# Patient Record
Sex: Male | Born: 1970 | Race: White | Hispanic: No | Marital: Single | Smoking: Never smoker
Health system: Southern US, Community
[De-identification: ages and names within clinical notes are randomized; demographics above are authoritative.]

## PROBLEM LIST (undated history)

## (undated) DIAGNOSIS — I10 Essential (primary) hypertension: Secondary | ICD-10-CM

## (undated) DIAGNOSIS — R55 Syncope and collapse: Secondary | ICD-10-CM

## (undated) DIAGNOSIS — C801 Malignant (primary) neoplasm, unspecified: Secondary | ICD-10-CM

---

## 2015-12-08 ENCOUNTER — Emergency Department (HOSPITAL_BASED_OUTPATIENT_CLINIC_OR_DEPARTMENT_OTHER)
Admission: EM | Admit: 2015-12-08 | Discharge: 2015-12-08 | Disposition: A | Discharge status: Home / Self Care | Payer: BLUE CROSS/BLUE SHIELD | Attending: Emergency Medicine | Admitting: Emergency Medicine

## 2015-12-08 ENCOUNTER — Encounter (HOSPITAL_BASED_OUTPATIENT_CLINIC_OR_DEPARTMENT_OTHER): Payer: Self-pay | Admitting: Emergency Medicine

## 2015-12-08 ENCOUNTER — Emergency Department (HOSPITAL_BASED_OUTPATIENT_CLINIC_OR_DEPARTMENT_OTHER): Payer: BLUE CROSS/BLUE SHIELD

## 2015-12-08 DIAGNOSIS — I10 Essential (primary) hypertension: Secondary | ICD-10-CM | POA: Insufficient documentation

## 2015-12-08 DIAGNOSIS — Z79899 Other long term (current) drug therapy: Secondary | ICD-10-CM | POA: Diagnosis not present

## 2015-12-08 DIAGNOSIS — R079 Chest pain, unspecified: Secondary | ICD-10-CM

## 2015-12-08 DIAGNOSIS — R0789 Other chest pain: Secondary | ICD-10-CM | POA: Insufficient documentation

## 2015-12-08 HISTORY — DX: Syncope and collapse: R55

## 2015-12-08 HISTORY — DX: Essential (primary) hypertension: I10

## 2015-12-08 LAB — CBC
HCT: 44.2 % (ref 39.0–52.0)
Hemoglobin: 15.5 g/dL (ref 13.0–17.0)
MCH: 32.4 pg (ref 26.0–34.0)
MCHC: 35.1 g/dL (ref 30.0–36.0)
MCV: 92.5 fL (ref 78.0–100.0)
Platelets: 259 10*3/uL (ref 150–400)
RBC: 4.78 MIL/uL (ref 4.22–5.81)
RDW: 12.9 % (ref 11.5–15.5)
WBC: 12.8 10*3/uL — ABNORMAL HIGH (ref 4.0–10.5)

## 2015-12-08 LAB — BASIC METABOLIC PANEL
Anion gap: 10 (ref 5–15)
BUN: 17 mg/dL (ref 6–20)
CO2: 25 mmol/L (ref 22–32)
Calcium: 9.2 mg/dL (ref 8.9–10.3)
Chloride: 103 mmol/L (ref 101–111)
Creatinine, Ser: 1.01 mg/dL (ref 0.61–1.24)
GFR calc Af Amer: >60 mL/min (ref >60)
GFR calc non Af Amer: >60 mL/min (ref >60)
Glucose, Bld: 115 mg/dL — ABNORMAL HIGH (ref 65–99)
Potassium: 3.8 mmol/L (ref 3.5–5.1)
Sodium: 138 mmol/L (ref 135–145)

## 2015-12-08 LAB — TROPONIN I
Troponin I: <0.03 ng/mL (ref <0.031)
Troponin I: <0.03 ng/mL (ref <0.031)

## 2015-12-08 NOTE — ED Notes (Signed)
Pt requested something to eat or drink. Dr Wilson Singer aware. Pt given crackers, peanut butter and coke per his request.

## 2015-12-08 NOTE — ED Provider Notes (Signed)
CSN: [id number]     Arrival date & time 12/08/15  0718 History   First MD Initiated Contact with Patient 12/08/15 0725     Chief Complaint  Patient presents with  . Chest Pain     (Consider location/radiation/quality/duration/timing/severity/associated sxs/prior Treatment) HPI   45 year old male with chest pain. Patient describes waking up from sleep at approximately 5 AM with tightness in his chest. He sat up in bed and began to feel dizzy and mildly diaphoretic. He reports that his pain felt better when sitting up though. While sitting up and began having abdominal pain felt the urge to have a bowel movement. He went to the bathroom his abdominal pain improved. He continues to have the chest tightness though. It has been constant since it first started but it progressively been improving. He has noticed that it is worse when he takes a deep breath. He does not feel short of breath though. No cough. No fevers or chills. No unusual leg pain or swelling. No known history of CAD that he is aware of. History of hypertension.  Past Medical History  Diagnosis Date  . Hypertension   . Vasovagal episode    History reviewed. No pertinent past surgical history. No family history on file. Social History  Substance Use Topics  . Smoking status: Never Smoker   . Smokeless tobacco: None  . Alcohol Use: Yes    Review of Systems  All systems reviewed and negative, other than as noted in HPI.   Allergies  Penicillins  Home Medications   Prior to Admission medications   Medication Sig Start Date End Date Taking? Authorizing Provider  lisinopril (PRINIVIL,ZESTRIL) 20 MG tablet Take 20 mg by mouth daily.   Yes Historical Provider, MD   BP 183/112 mmHg  Pulse 88  Temp(Src) 98.1 F (36.7 C) (Oral)  Resp 20  Ht 5\' 11"  (1.803 m)  Wt 238 lb (107.956 kg)  BMI 33.21 kg/m2  SpO2 97% Physical Exam  Constitutional: He appears well-developed and well-nourished. No distress.  HENT:  Head:  Normocephalic and atraumatic.  Eyes: Conjunctivae are normal. Right eye exhibits no discharge. Left eye exhibits no discharge.  Neck: Neck supple.  Cardiovascular: Normal rate, regular rhythm and normal heart sounds.  Exam reveals no gallop and no friction rub.   No murmur heard. Pulmonary/Chest: Effort normal and breath sounds normal. No respiratory distress. He exhibits no tenderness.  Abdominal: Soft. He exhibits no distension. There is no tenderness.  Musculoskeletal: He exhibits no edema or tenderness.  Lower extremities symmetric as compared to each other. No calf tenderness. Negative Homan's. No palpable cords.   Neurological: He is alert.  Skin: Skin is warm and dry.  Psychiatric: He has a normal mood and affect. His behavior is normal. Thought content normal.  Nursing note and vitals reviewed.   ED Course  Procedures (including critical care time) Labs Review Labs Reviewed  BASIC METABOLIC PANEL - Abnormal; Notable for the following:    Glucose, Bld 115 (*)    All other components within normal limits  CBC - Abnormal; Notable for the following:    WBC 12.8 (*)    All other components within normal limits  TROPONIN I  TROPONIN I    Imaging Review No results found. I have personally reviewed and evaluated these images and lab results as part of my medical decision-making.   EKG Interpretation   Date/Time:  Wednesday Dec 08 2015 07:25:29 EDT Ventricular Rate:  93 PR Interval:  186 QRS  Duration: 120 QT Interval:  390 QTC Calculation: 485 R Axis:   37 Text Interpretation:  Sinus rhythm Nonspecific intraventricular conduction  delay Borderline T abnormalities, diffuse leads No old tracing to compare  Confirmed by Rihanna Marseille  MD, [place] ([id number]) on 12/08/2015 7:50:07 AM      MDM   Final diagnoses:  Chest pain, unspecified chest pain type    45 year old male with chest tightness. Improved since onset but still mildly persistent. His exam is nonfocal. He is afebrile.  He has no increased work of breathing. Hypertension was noted. His EKG is not normal with diffuse T-wave abnormalities and intraventricular conduction delay. I do not have an old one to compare it to. Consider pericarditis with positional changes. Potentially could be. No rub on exam. No recent viral prodrome. Doubt PE, dissection or infectios process.  8:21 AM Labs unremarkable. Pt updated. No new complaints. Still very mild/vague discomfort but still improving. Will repeat troponin.   Repeat troponin is normal.  I doubt that this is ACS.  At this time, I feel he is stable for discharge.  Given his age and history of hypertension and I think it may be prudent for to obtain stress test.  [place] follow-up with his PCP to discuss this further.  Return precautions sooner were discussed.  Virgel Manifold, MD 12/19/15 [phone]

## 2015-12-08 NOTE — Discharge Instructions (Signed)
Nonspecific Chest Pain  °Chest pain can be caused by many different conditions. There is always a chance that your pain could be related to something serious, such as a heart attack or a blood clot in your lungs. Chest pain can also be caused by conditions that are not life-threatening. If you have chest pain, it is very important to follow up with your health care provider. °CAUSES  °Chest pain can be caused by: °· Heartburn. °· Pneumonia or bronchitis. °· Anxiety or stress. °· Inflammation around your heart (pericarditis) or lung (pleuritis or pleurisy). °· A blood clot in your lung. °· A collapsed lung (pneumothorax). It can develop suddenly on its own (spontaneous pneumothorax) or from trauma to the chest. °· Shingles infection (varicella-zoster virus). °· Heart attack. °· Damage to the bones, muscles, and cartilage that make up your chest wall. This can include: °¨ Bruised bones due to injury. °¨ Strained muscles or cartilage due to frequent or repeated coughing or overwork. °¨ Fracture to one or more ribs. °¨ Sore cartilage due to inflammation (costochondritis). °RISK FACTORS  °Risk factors for chest pain may include: °· Activities that increase your risk for trauma or injury to your chest. °· Respiratory infections or conditions that cause frequent coughing. °· Medical conditions or overeating that can cause heartburn. °· Heart disease or family history of heart disease. °· Conditions or health behaviors that increase your risk of developing a blood clot. °· Having had chicken pox (varicella zoster). °SIGNS AND SYMPTOMS °Chest pain can feel like: °· Burning or tingling on the surface of your chest or deep in your chest. °· Crushing, pressure, aching, or squeezing pain. °· Dull or sharp pain that is worse when you move, cough, or take a deep breath. °· Pain that is also felt in your back, neck, shoulder, or arm, or pain that spreads to any of these areas. °Your chest pain may come and go, or it may stay  constant. °DIAGNOSIS °Lab tests or other studies may be needed to find the cause of your pain. Your health care provider may have you take a test called an ambulatory ECG (electrocardiogram). An ECG records your heartbeat patterns at the time the test is performed. You may also have other tests, such as: °· Transthoracic echocardiogram (TTE). During echocardiography, sound waves are used to create a picture of all of the heart structures and to look at how blood flows through your heart. °· Transesophageal echocardiogram (TEE). This is a more advanced imaging test that obtains images from inside your body. It allows your health care provider to see your heart in finer detail. °· Cardiac monitoring. This allows your health care provider to monitor your heart rate and rhythm in real time. °· Holter monitor. This is a portable device that records your heartbeat and can help to diagnose abnormal heartbeats. It allows your health care provider to track your heart activity for several days, if needed. °· Stress tests. These can be done through exercise or by taking medicine that makes your heart beat more quickly. °· Blood tests. °· Imaging tests. °TREATMENT  °Your treatment depends on what is causing your chest pain. Treatment may include: °· Medicines. These may include: °¨ Acid blockers for heartburn. °¨ Anti-inflammatory medicine. °¨ Pain medicine for inflammatory conditions. °¨ Antibiotic medicine, if an infection is present. °¨ Medicines to dissolve blood clots. °¨ Medicines to treat coronary artery disease. °· Supportive care for conditions that do not require medicines. This may include: °¨ Resting. °¨ Applying heat   or cold packs to injured areas. °¨ Limiting activities until pain decreases. °HOME CARE INSTRUCTIONS °· If you were prescribed an antibiotic medicine, finish it all even if you start to feel better. °· Avoid any activities that bring on chest pain. °· Do not use any tobacco products, including  cigarettes, chewing tobacco, or electronic cigarettes. If you need help quitting, ask your health care provider. °· Do not drink alcohol. °· Take medicines only as directed by your health care provider. °· Keep all follow-up visits as directed by your health care provider. This is important. This includes any further testing if your chest pain does not go away. °· If heartburn is the cause for your chest pain, you may be told to keep your head raised (elevated) while sleeping. This reduces the chance that acid will go from your stomach into your esophagus. °· Make lifestyle changes as directed by your health care provider. These may include: °¨ Getting regular exercise. Ask your health care provider to suggest some activities that are safe for you. °¨ Eating a heart-healthy diet. A registered dietitian can help you to learn healthy eating options. °¨ Maintaining a healthy weight. °¨ Managing diabetes, if necessary. °¨ Reducing stress. °SEEK MEDICAL CARE IF: °· Your chest pain does not go away after treatment. °· You have a rash with blisters on your chest. °· You have a fever. °SEEK IMMEDIATE MEDICAL CARE IF:  °· Your chest pain is worse. °· You have an increasing cough, or you cough up blood. °· You have severe abdominal pain. °· You have severe weakness. °· You faint. °· You have chills. °· You have sudden, unexplained chest discomfort. °· You have sudden, unexplained discomfort in your arms, back, neck, or jaw. °· You have shortness of breath at any time. °· You suddenly start to sweat, or your skin gets clammy. °· You feel nauseous or you vomit. °· You suddenly feel light-headed or dizzy. °· Your heart begins to beat quickly, or it feels like it is skipping beats. °These symptoms may represent a serious problem that is an emergency. Do not wait to see if the symptoms will go away. Get medical help right away. Call your local emergency services (911 in the [place] Do not drive yourself to the hospital. °  °This  information is not intended to replace advice given to you by your health care provider. Make sure you discuss any questions you have with your health care provider. °  °Document Released: 04/05/2005 Document Revised: 07/17/2014 Document Reviewed: 01/30/2014 °Elsevier Interactive Patient Education ©2016 [employer]. ° °

## 2015-12-08 NOTE — ED Notes (Signed)
MD at bedside. 

## 2015-12-08 NOTE — ED Notes (Addendum)
Pt reports be woke up at 5 am with chest pain in the center of his chest and dizziness. Pt states he then had abd pain and had a BM. Pt denies dizziness at present. Pt reports breaking out in a sweat. Pt states pain is worse when he lays flat. Denies SOB.

## 2018-01-17 IMAGING — CR DG CHEST 2V
2 series · 2 of 2 positions shown · non-contrast
Comparison: None.

CLINICAL DATA: Chest pain and dizziness 1 day

EXAM:
CHEST  2 VIEW

[w chest pa]
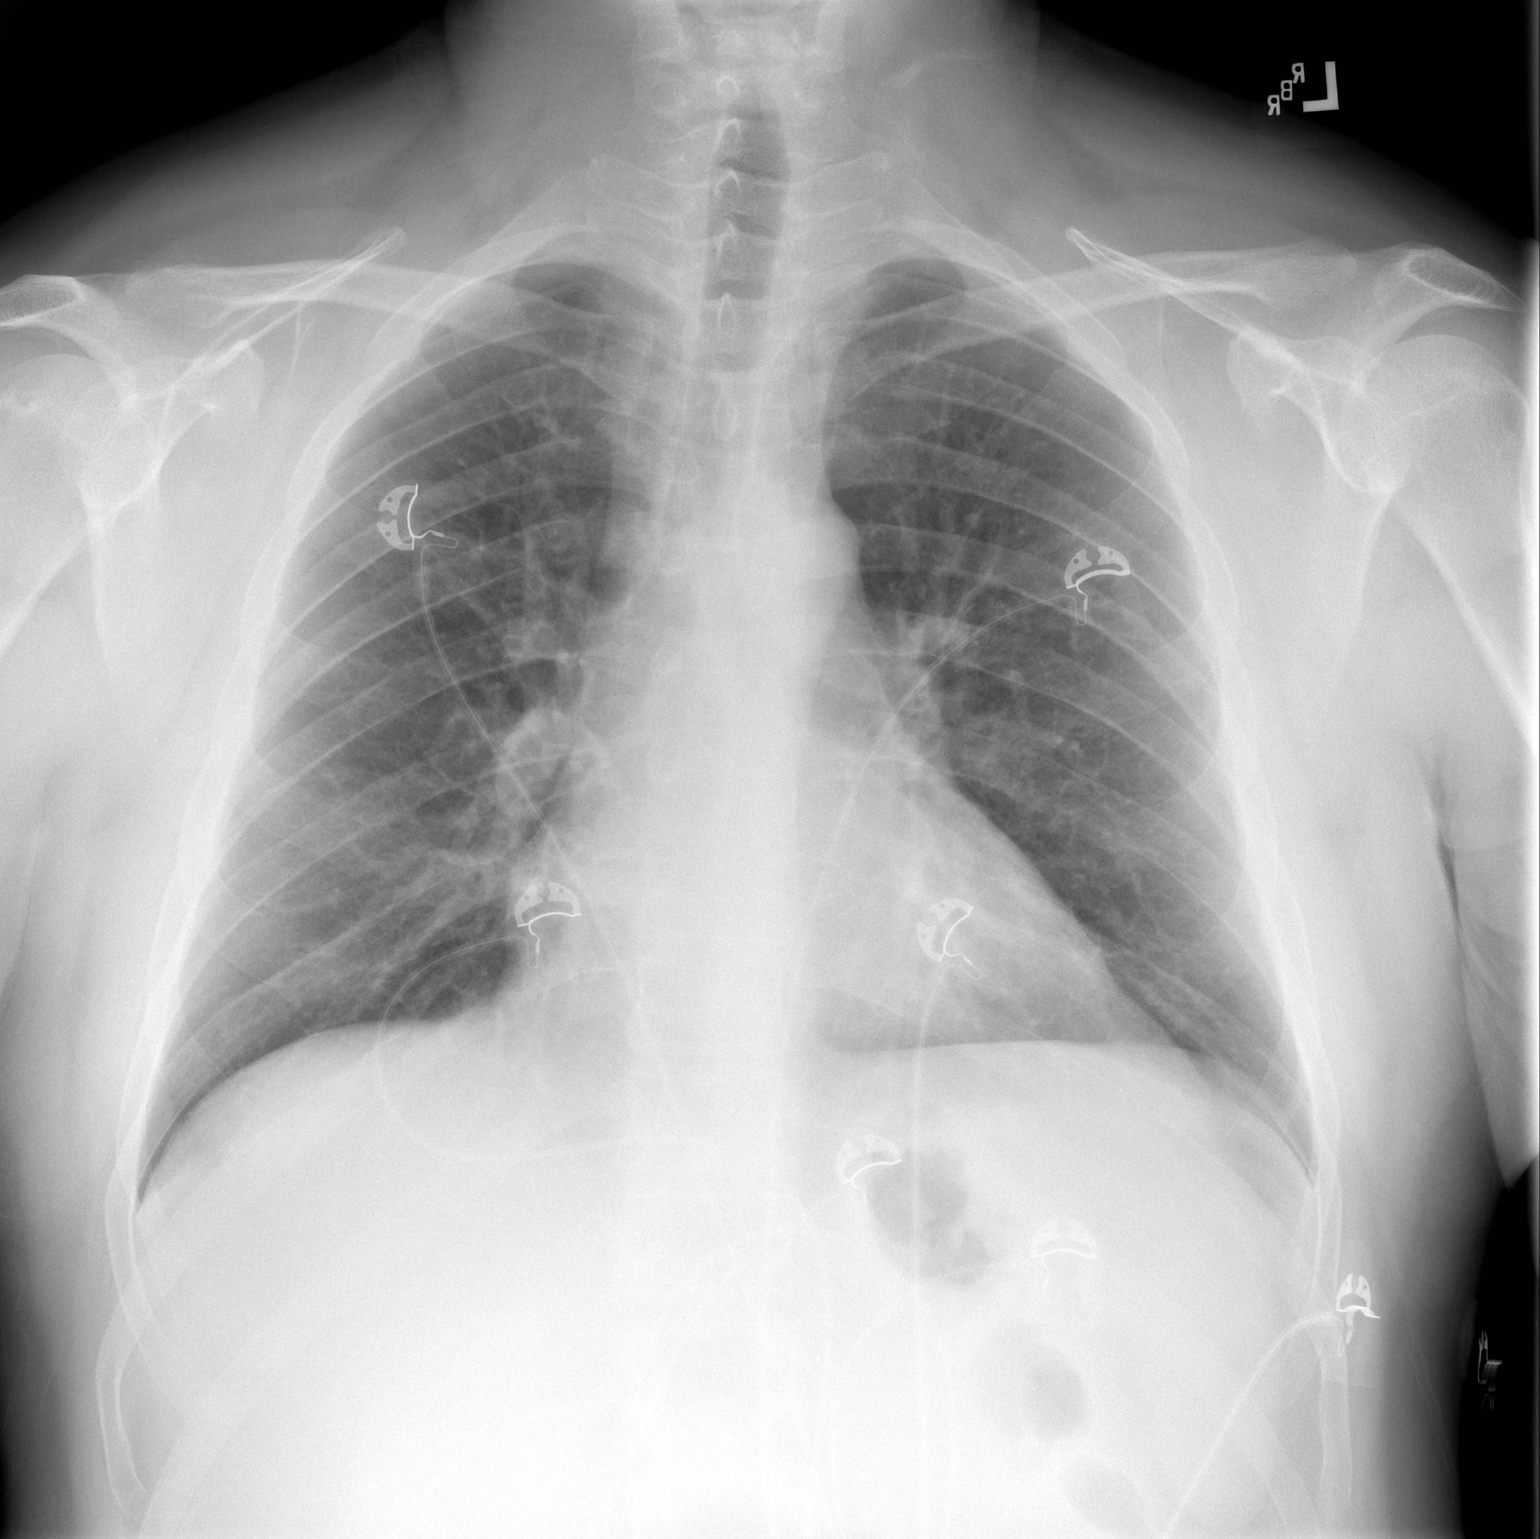

[w chest lat]
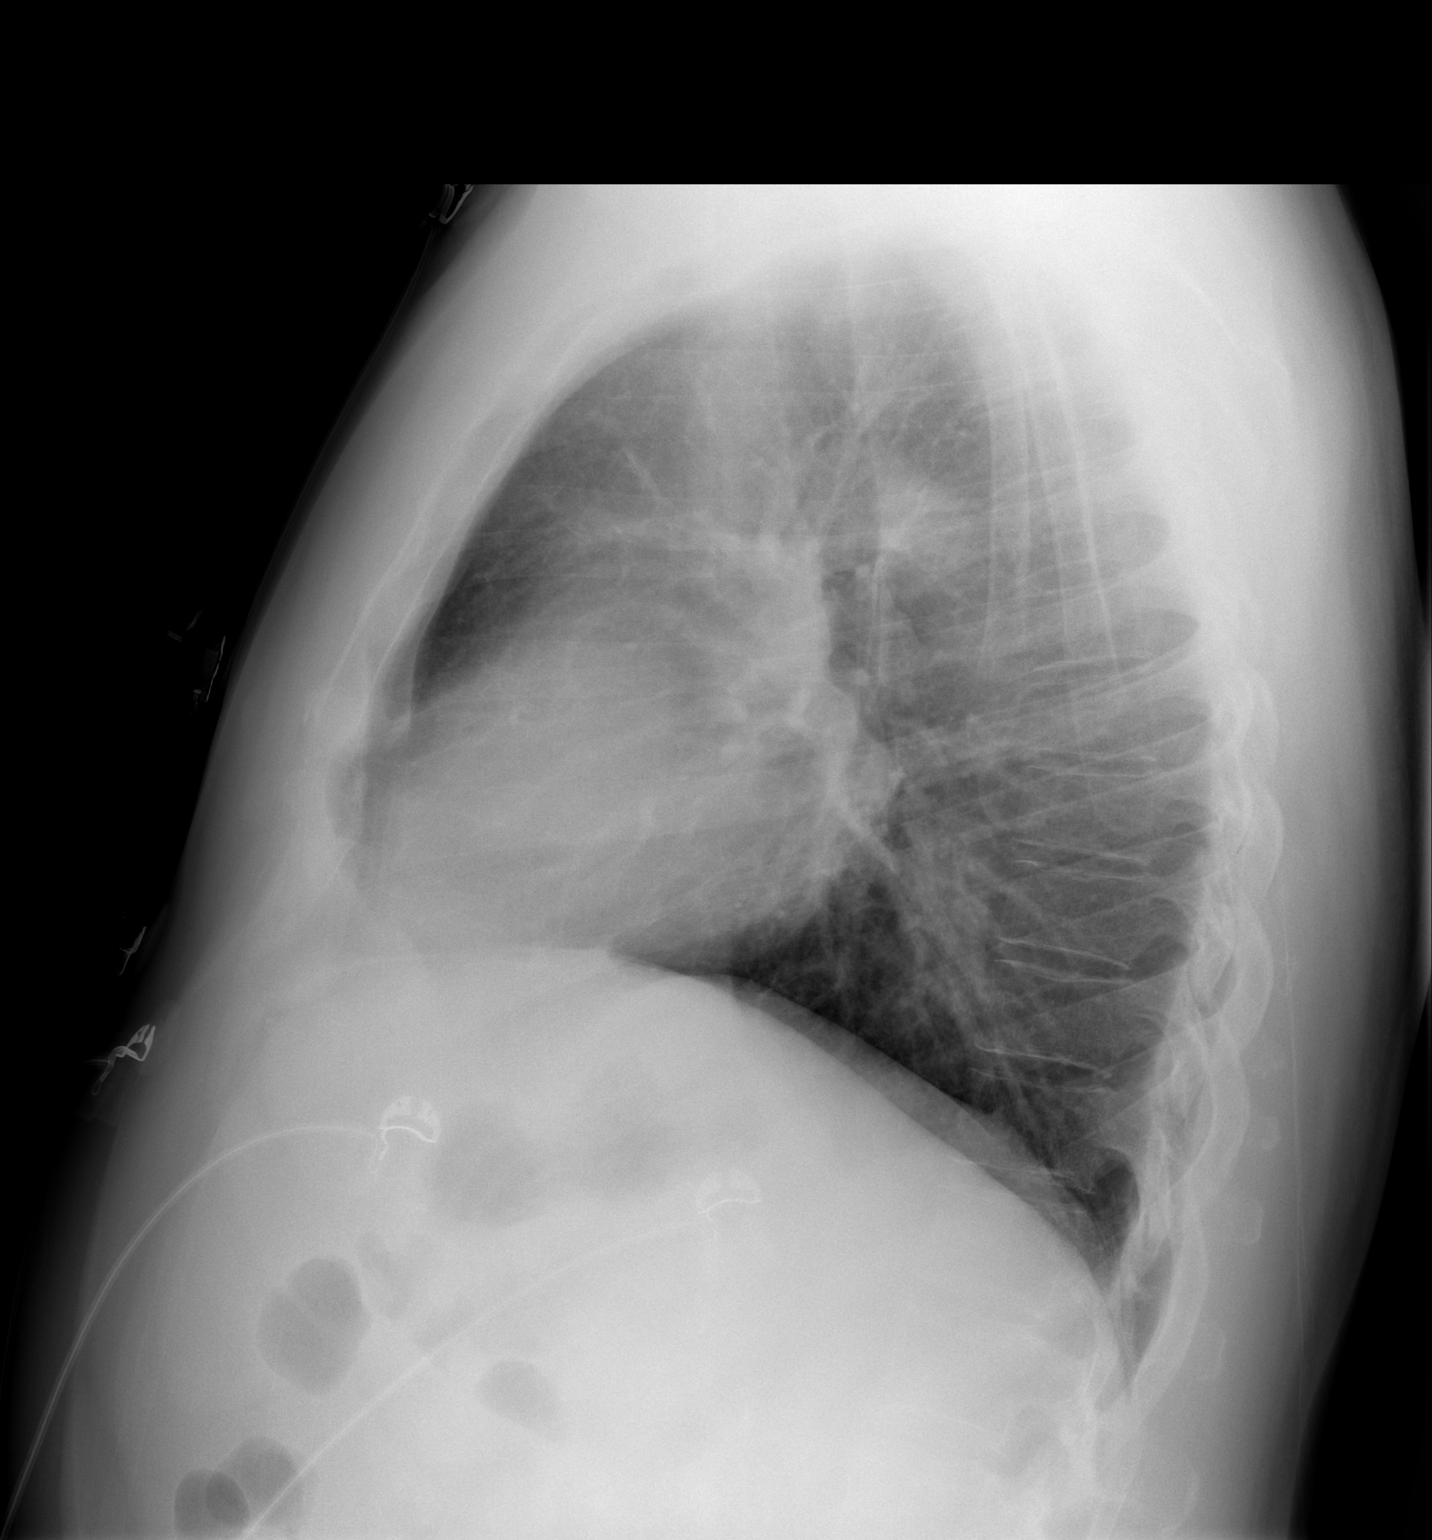

[2 of 2 positions shown; findings below may reference images not displayed]

FINDINGS: Lungs are clear. Heart size and pulmonary vascularity are normal. No
adenopathy. No pneumothorax. No bone lesions.
IMPRESSION: No edema or consolidation.

## 2019-09-04 DIAGNOSIS — L920 Granuloma annulare: Secondary | ICD-10-CM | POA: Diagnosis not present

## 2020-04-07 DIAGNOSIS — I10 Essential (primary) hypertension: Secondary | ICD-10-CM | POA: Diagnosis not present

## 2020-04-07 DIAGNOSIS — E782 Mixed hyperlipidemia: Secondary | ICD-10-CM | POA: Diagnosis not present

## 2020-04-07 DIAGNOSIS — R55 Syncope and collapse: Secondary | ICD-10-CM | POA: Diagnosis not present

## 2020-06-25 ENCOUNTER — Telehealth: Payer: Self-pay | Admitting: Pharmacist

## 2020-06-25 ENCOUNTER — Encounter: Payer: Self-pay | Admitting: Internal Medicine

## 2020-06-25 ENCOUNTER — Other Ambulatory Visit: Payer: Self-pay

## 2020-06-25 ENCOUNTER — Ambulatory Visit (INDEPENDENT_AMBULATORY_CARE_PROVIDER_SITE_OTHER): Payer: BLUE CROSS/BLUE SHIELD | Admitting: Internal Medicine

## 2020-06-25 ENCOUNTER — Telehealth: Payer: Self-pay | Admitting: Radiology

## 2020-06-25 VITALS — BP 110/70 | HR 75 | Resp 12 | Ht 70.0 in | Wt 230.0 lb

## 2020-06-25 DIAGNOSIS — R55 Syncope and collapse: Secondary | ICD-10-CM | POA: Diagnosis not present

## 2020-06-25 NOTE — Telephone Encounter (Signed)
Enrolled patient for a 30 day [employer] Event Monitor to be mailed to patients home  

## 2020-06-25 NOTE — Telephone Encounter (Signed)
Pt at [facility] today for his daughter's visit and passed out while seated ~8:50am. No noted triggers. Pt was moved to the floor and legs elevated, he came to and was able to communicate. Cardiologist is Dr Mauricio Po at [place], has had previous syncopal episodes which typically had triggers (standing up too quickly at a restaurant, drinking too much). States he's previously had a vasovagal diagnosis.  Glucose 115, BP 110/56, HR 75, O2 96% EKG: NSR, RBBB, QTc 471  Pt added to Dr Oralia Rud schedule today.

## 2020-06-25 NOTE — Patient Instructions (Addendum)
Medication Instructions:  Your physician recommends that you continue on your current medications as directed. Please refer to the Current Medication list given to you today.  *If you need a refill on your cardiac medications before your next appointment, please call your pharmacy*   Lab Work: None If you have labs (blood work) drawn today and your tests are completely normal, you will receive your results only by:  [facility] (if you have MyChart) OR  A paper copy in the mail If you have any lab test that is abnormal or we need to change your treatment, we will call you to review the results.   Testing/Procedures: Your physician has requested that you have an echocardiogram. Echocardiography is a painless test that uses sound waves to create images of your heart. It provides your doctor with information about the size and shape of your heart and how well your hearts chambers and valves are working. This procedure takes approximately one hour. There are no restrictions for this procedure.  Your physician has recommended that you wear an event monitor. Event monitors are medical devices that record the hearts electrical activity. Doctors most often [place] these monitors to diagnose arrhythmias. Arrhythmias are problems with the speed or rhythm of the heartbeat. The monitor is a small, portable device. You can wear one while you do your normal daily activities. This is usually used to diagnose what is causing palpitations/syncope (passing out).   Follow-Up: At [facility], you and your health needs are our priority.  As part of our continuing mission to provide you with exceptional heart care, we have created designated Provider Care Teams.  These Care Teams include your primary Cardiologist (physician) and Advanced Practice Providers (APPs -  Physician Assistants and Nurse Practitioners) who all work together to provide you with the care you need, when you need it.  We recommend signing  up for the patient portal called "MyChart".  Sign up information is provided on this After Visit Summary.  MyChart is used to connect with patients for Virtual Visits (Telemedicine).  Patients are able to view lab/test results, encounter notes, upcoming appointments, etc.  Non-urgent messages can be sent to your provider as well.   To learn more about what you can do with MyChart, go to [email or web address].    Your next appointment:   2-3 month(s)  The format for your next appointment:   In Person  Provider:   You may see Dr. Rudean Haskell or one of the following Advanced Practice Providers on your designated Care Team:    Melina Copa, PA-C  Ermalinda Barrios, PA-C    Other Instructions  [employer] Cardiac Event Monitor Instructions Your physician has requested you wear your cardiac event monitor for 30 days. [employer] may call or text to confirm a shipping address. The monitor will be sent to a land address via UPS. [employer] will not ship a monitor to a PO BOX. It typically takes 3-5 days to receive your monitor after it has been enrolled. [employer] will assist with USPS tracking if your package is delayed. The telephone number for [employer] is [phone]. Once you have received your monitor, please review the enclosed instructions. Instruction tutorials can also be viewed under help and settings on the enclosed cell phone. Your monitor has already been registered assigning a specific monitor serial # to you.  Applying the monitor Remove cell phone from case and turn it on. The cell phone works as Dealer and needs to be within [employer] of  you at all times. The cell phone will need to be charged on a daily basis. We recommend you plug the cell phone into the enclosed charger at your bedside table every night.  Monitor batteries: You will receive two monitor batteries labelled #1 and #2. These are your recorders. Plug battery #2 onto the second connection on  the enclosed charger. Keep one battery on the charger at all times. This will keep the monitor battery deactivated. It will also keep it fully charged for when you need to switch your monitor batteries. A small light will be blinking on the battery emblem when it is charging. The light on the battery emblem will remain on when the battery is fully charged.  Open package of a Monitor strip. Insert battery #1 into black hood on strip and gently squeeze monitor battery onto connection as indicated in instruction booklet. Set aside while preparing skin.  Choose location for your strip, vertical or horizontal, as indicated in the instruction booklet. Shave to remove all hair from location. There cannot be any lotions, oils, powders, or colognes on skin where monitor is to be applied. Wipe skin clean with enclosed Saline wipe. Dry skin completely.  Peel paper labeled #1 off the back of the Monitor strip exposing the adhesive. Place the monitor on the chest in the vertical or horizontal position shown in the instruction booklet. One arrow on the monitor strip must be pointing upward. Carefully remove paper labeled #2, attaching remainder of strip to your skin. Try not to create any folds or wrinkles in the strip as you apply it.  Firmly press and release the circle in the center of the monitor battery. You will hear a small beep. This is turning the monitor battery on. The heart emblem on the monitor battery will light up every 5 seconds if the monitor battery in turned on and connected to the patient securely. Do not push and hold the circle down as this turns the monitor battery off. The cell phone will locate the monitor battery. A screen will appear on the cell phone checking the connection of your monitor strip. This may read poor connection initially but change to good connection within the next minute. Once your monitor accepts the connection you will hear a series of 3 beeps followed by a  climbing crescendo of beeps. A screen will appear on the cell phone showing the two monitor strip placement options. Touch the picture that demonstrates where you applied the monitor strip.  Your monitor strip and battery are waterproof. You are able to shower, bathe, or swim with the monitor on. They just ask you do not submerge deeper than 3 feet underwater. We recommend removing the monitor if you are swimming in a lake, river, or ocean.  Your monitor battery will need to be switched to a fully charged monitor battery approximately once a week. The cell phone will alert you of an action which needs to be made.  On the cell phone, tap for details to reveal connection status, monitor battery status, and cell phone battery status. The green dots indicates your monitor is in good status. A red dot indicates there is something that needs your attention.  To record a symptom, click the circle on the monitor battery. In 30-60 seconds a list of symptoms will appear on the cell phone. Select your symptom and tap save. Your monitor will record a sustained or significant arrhythmia regardless of you clicking the button. Some patients do not feel the  heart rhythm irregularities. [employer] will notify us of any serious or critical events.  Refer to instruction booklet for instructions on switching batteries, changing strips, the Do not disturb or Pause features, or any additional questions.  Call [employer] at [phone], to confirm your monitor is transmitting and record your baseline. They will answer any questions you may have regarding the monitor instructions at that time.  Returning the monitor to [place] all equipment back into blue box. Peel off strip of paper to expose adhesive and close box securely. There is a prepaid UPS shipping label on this box. Drop in a UPS drop box, or at a UPS facility like [employer]. You may also contact [employer] to arrange UPS to pick up monitor  package at your home.

## 2020-06-25 NOTE — Progress Notes (Signed)
Cardiology Office Note:    Date:  06/25/2020   ID:  Joel Villanueva, DOB April 30, 1971, MRN [id number]  PCP:  No primary care provider on file.  [facility] Cardiologist:  No primary care provider on file.  [facility] Electrophysiologist:  None   Urgent visit CC:  Passed out during his daughter's Cardiology visit.  History of Present Illness:    Joel Villanueva is a 49 y.o. male with a hx of HTN, Vasovagal Syncope who presents for evaluation:  Passed out during his daughter's visit.  Patient felt hot during daughter's visit.  No CP, SOB, DOE.  Has had vasovagal syncope in the past:  Remote history of CP that woke him from sleep and ED visit without clear etiology.  Concern for COVID -19 infection 09/2018 (anosmia, fever, chills, and viral prodrome with COVID occurring in the church).  After this has had multiple episodes of syncope:  May 2021 related to increase alcohol the night prior at a wedding and decreased PO intake.  No sit/stand dizziness.  Has had episode of near syncope in the summer and in September.  No passing out.  Patient noted he felt hot with the mask on (works from home and is not used to wearing masks).  Felt flush and diaphoretic.  Assisted patient to the ground:  BMP stable 110/56.  BG 75.  With leg raise pallor improved.  Got PO Intake at our office and sx resolved.  Able to sit, stand, and return to the sitting in a chair.  BP Post 110/70.  Past Medical History:  Diagnosis Date  . Hypertension   . Vasovagal episode     No past surgical history on file.  Current Medications: No outpatient medications have been marked as taking for the 06/25/20 encounter (Office Visit) with Werner Lean, MD.     Allergies:   Penicillins   Social History   Socioeconomic History  . Marital status: Single    Spouse name: Not on file  . Number of children: Not on file  . Years of education: Not on file  . Highest education level: Not on file  Occupational History   . Not on file  Tobacco Use  . Smoking status: Never Smoker  . Smokeless tobacco: Never Used  Substance and Sexual Activity  . Alcohol use: Yes  . Drug use: No  . Sexual activity: Not on file  Other Topics Concern  . Not on file  Social History Narrative  . Not on file   Social Determinants of Health   Financial Resource Strain: Not on file  Food Insecurity: Not on file  Transportation Needs: Not on file  Physical Activity: Not on file  Stress: Not on file  Social Connections: Not on file     Family History: History of coronary artery disease notable for grandfather with MI. History of heart failure notable for no members. History of arrhythmia notable for no members.  ROS:   Please see the history of present illness.     All other systems reviewed and are negative.  EKGs/Labs/Other Studies Reviewed:    The following studies were reviewed today:   EKG:  EKG is  ordered today.  The ekg ordered today demonstrates SR 75 RBBB with nonspecific TWI   OSH Stress Echo Stress Testing: Date: 03/10/19 Results: Normal EF and no Ischemia; cannot see full study  Recent Labs: No results found for requested labs within last 8760 hours.  Recent Lipid Panel No results found for: CHOL, TRIG,  HDL, CHOLHDL, VLDL, LDLCALC, LDLDIRECT   Risk Assessment/Calculations:     N/A  Physical Exam:    VS:  BP 110/56 Heart rate 75 bpm Pulse 02 96% RR 12  Wt Readings from Last 3 Encounters:  06/25/20 230 lb (104.3 kg)  12/08/15 238 lb (108 kg)    Post syncope exam  GEN:  Well nourished, well developed in no acute distress HEENT: Normal NECK: No JVD; No carotid bruits LYMPHATICS: No lymphadenopathy CARDIAC: RRR, no murmurs, rubs, gallops RESPIRATORY:  Clear to auscultation without rales, wheezing or rhonchi  ABDOMEN: Soft, non-tender, non-distended MUSCULOSKELETAL:  No edema; No deformity  SKIN: Warm and dry NEUROLOGIC:  Alert and oriented x 3 PSYCHIATRIC:  Normal affect    ASSESSMENT:    1. Syncope, unspecified syncope type    PLAN:    In order of problems listed above:  Syncope - Rapid Response Care as above - will get echocardiogram and [employer] Monitor (Live 30 day) - In follow up if negative may look at [facility] and [facility] - Patient's daughter will drive home  2-3 follow up unless new symptoms or abnormal test results warranting change in plan     Medication Adjustments/Labs and Tests Ordered: Current medicines are reviewed at length with the patient today.  Concerns regarding medicines are outlined above.  Orders Placed This Encounter  Procedures  . Cardiac event monitor  . ECHOCARDIOGRAM COMPLETE   No orders of the defined types were placed in this encounter.   Patient Instructions  Medication Instructions:  Your physician recommends that you continue on your current medications as directed. Please refer to the Current Medication list given to you today.  *If you need a refill on your cardiac medications before your next appointment, please call your pharmacy*   Lab Work: None If you have labs (blood work) drawn today and your tests are completely normal, you will receive your results only by: Marland Kitchen MyChart Message (if you have MyChart) OR . A paper copy in the mail If you have any lab test that is abnormal or we need to change your treatment, we will call you to review the results.   Testing/Procedures: Your physician has requested that you have an echocardiogram. Echocardiography is a painless test that uses sound waves to create images of your heart. It provides your doctor with information about the size and shape of your heart and how well your heart's chambers and valves are working. This procedure takes approximately one hour. There are no restrictions for this procedure.  Your physician has recommended that you wear an event monitor. Event monitors are medical devices that record the heart's electrical  activity. Doctors most often [place] these monitors to diagnose arrhythmias. Arrhythmias are problems with the speed or rhythm of the heartbeat. The monitor is a small, portable device. You can wear one while you do your normal daily activities. This is usually used to diagnose what is causing palpitations/syncope (passing out).   Follow-Up: At [facility], you and your health needs are our priority.  As part of our continuing mission to provide you with exceptional heart care, we have created designated Provider Care Teams.  These Care Teams include your primary Cardiologist (physician) and Advanced Practice Providers (APPs -  Physician Assistants and Nurse Practitioners) who all work together to provide you with the care you need, when you need it.  We recommend signing up for the patient portal called "MyChart".  Sign up information is provided on this After Visit  Summary.  MyChart is used to connect with patients for Virtual Visits (Telemedicine).  Patients are able to view lab/test results, encounter notes, upcoming appointments, etc.  Non-urgent messages can be sent to your provider as well.   To learn more about what you can do with MyChart, go to [email or web address].    Your next appointment:   2-3 month(s)  The format for your next appointment:   In Person  Provider:   You may see Dr. Rudean Haskell or one of the following Advanced Practice Providers on your designated Care Team:    Melina Copa, PA-C  Ermalinda Barrios, PA-C    Other Instructions  [employer] Cardiac Event Monitor Instructions Your physician has requested you wear your cardiac event monitor for 30 days. [employer] may call or text to confirm a shipping address. The monitor will be sent to a land address via UPS. [employer] will not ship a monitor to a PO BOX. It typically takes 3-5 days to receive your monitor after it has been enrolled. [employer] will assist with USPS tracking if your package is delayed.  The telephone number for [employer] is [phone]. Once you have received your monitor, please review the enclosed instructions. Instruction tutorials can also be viewed under help and settings on the enclosed cell phone. Your monitor has already been registered assigning a specific monitor serial # to you.  Applying the monitor Remove cell phone from case and turn it on. The cell phone works as Dealer and needs to be within [employer] of you at all times. The cell phone will need to be charged on a daily basis. We recommend you plug the cell phone into the enclosed charger at your bedside table every night.  Monitor batteries: You will receive two monitor batteries labelled #1 and #2. These are your recorders. Plug battery #2 onto the second connection on the enclosed charger. Keep one battery on the charger at all times. This will keep the monitor battery deactivated. It will also keep it fully charged for when you need to switch your monitor batteries. A small light will be blinking on the battery emblem when it is charging. The light on the battery emblem will remain on when the battery is fully charged.  Open package of a Monitor strip. Insert battery #1 into black hood on strip and gently squeeze monitor battery onto connection as indicated in instruction booklet. Set aside while preparing skin.  Choose location for your strip, vertical or horizontal, as indicated in the instruction booklet. Shave to remove all hair from location. There cannot be any lotions, oils, powders, or colognes on skin where monitor is to be applied. Wipe skin clean with enclosed Saline wipe. Dry skin completely.  Peel paper labeled #1 off the back of the Monitor strip exposing the adhesive. Place the monitor on the chest in the vertical or horizontal position shown in the instruction booklet. One arrow on the monitor strip must be pointing upward. Carefully remove paper labeled #2, attaching  remainder of strip to your skin. Try not to create any folds or wrinkles in the strip as you apply it.  Firmly press and release the circle in the center of the monitor battery. You will hear a small beep. This is turning the monitor battery on. The heart emblem on the monitor battery will light up every 5 seconds if the monitor battery in turned on and connected to the patient securely. Do not push and hold the circle down as this turns the  monitor battery off. The cell phone will locate the monitor battery. A screen will appear on the cell phone checking the connection of your monitor strip. This may read poor connection initially but change to good connection within the next minute. Once your monitor accepts the connection you will hear a series of 3 beeps followed by a climbing crescendo of beeps. A screen will appear on the cell phone showing the two monitor strip placement options. Touch the picture that demonstrates where you applied the monitor strip.  Your monitor strip and battery are waterproof. You are able to shower, bathe, or swim with the monitor on. They just ask you do not submerge deeper than 3 feet underwater. We recommend removing the monitor if you are swimming in a lake, river, or ocean.  Your monitor battery will need to be switched to a fully charged monitor battery approximately once a week. The cell phone will alert you of an action which needs to be made.  On the cell phone, tap for details to reveal connection status, monitor battery status, and cell phone battery status. The green dots indicates your monitor is in good status. A red dot indicates there is something that needs your attention.  To record a symptom, click the circle on the monitor battery. In 30-60 seconds a list of symptoms will appear on the cell phone. Select your symptom and tap save. Your monitor will record a sustained or significant arrhythmia regardless of you clicking the button. Some  patients do not feel the heart rhythm irregularities. [employer] will notify us of any serious or critical events.  Refer to instruction booklet for instructions on switching batteries, changing strips, the Do not disturb or Pause features, or any additional questions.  Call [employer] at [phone], to confirm your monitor is transmitting and record your baseline. They will answer any questions you may have regarding the monitor instructions at that time.  Returning the monitor to [place] all equipment back into blue box. Peel off strip of paper to expose adhesive and close box securely. There is a prepaid UPS shipping label on this box. Drop in a UPS drop box, or at a UPS facility like [employer]. You may also contact [employer] to arrange UPS to pick up monitor package at your home.      Signed, Werner Lean, MD  06/25/2020 10:17 AM    [place]

## 2020-06-29 NOTE — Addendum Note (Signed)
Addended by: Loren Racer on: 06/29/2020 11:06 AM   Modules accepted: Orders

## 2020-07-02 ENCOUNTER — Ambulatory Visit (INDEPENDENT_AMBULATORY_CARE_PROVIDER_SITE_OTHER): Payer: BLUE CROSS/BLUE SHIELD

## 2020-07-02 DIAGNOSIS — R55 Syncope and collapse: Secondary | ICD-10-CM

## 2020-07-19 ENCOUNTER — Other Ambulatory Visit (HOSPITAL_COMMUNITY): Payer: BLUE CROSS/BLUE SHIELD

## 2020-08-02 ENCOUNTER — Ambulatory Visit (HOSPITAL_COMMUNITY): Payer: 59 | Attending: Cardiology

## 2020-08-02 ENCOUNTER — Other Ambulatory Visit: Payer: Self-pay

## 2020-08-02 DIAGNOSIS — R55 Syncope and collapse: Secondary | ICD-10-CM

## 2020-08-02 LAB — ECHOCARDIOGRAM COMPLETE
Area-P 1/2: 2.14 cm2
S' Lateral: 3.45 cm

## 2020-08-06 ENCOUNTER — Telehealth: Payer: Self-pay

## 2020-08-06 NOTE — Telephone Encounter (Signed)
The patient has been notified of the result and verbalized understanding.  All questions (if any) were answered. Wilma Flavin, RN 08/06/2020 8:22 AM

## 2020-08-06 NOTE — Telephone Encounter (Signed)
-----   Message from Werner Lean, MD sent at 08/05/2020 11:35 AM EST ----- Results: Normal Plan: Continue plan  Werner Lean, MD

## 2020-09-13 ENCOUNTER — Other Ambulatory Visit: Payer: Self-pay

## 2020-09-13 ENCOUNTER — Encounter: Payer: Self-pay | Admitting: Internal Medicine

## 2020-09-13 ENCOUNTER — Ambulatory Visit (INDEPENDENT_AMBULATORY_CARE_PROVIDER_SITE_OTHER): Payer: 59 | Admitting: Internal Medicine

## 2020-09-13 VITALS — BP 144/89 | HR 68 | Ht 71.0 in | Wt 232.0 lb

## 2020-09-13 DIAGNOSIS — I10 Essential (primary) hypertension: Secondary | ICD-10-CM | POA: Insufficient documentation

## 2020-09-13 DIAGNOSIS — I4510 Unspecified right bundle-branch block: Secondary | ICD-10-CM | POA: Insufficient documentation

## 2020-09-13 DIAGNOSIS — I951 Orthostatic hypotension: Secondary | ICD-10-CM | POA: Diagnosis not present

## 2020-09-13 DIAGNOSIS — Z0000 Encounter for general adult medical examination without abnormal findings: Secondary | ICD-10-CM

## 2020-09-13 NOTE — Progress Notes (Signed)
Cardiology Office Note:    Date:  09/13/2020   ID:  Joel Villanueva, DOB February 11, 1971, MRN [id number]  PCP:  Pietro Cassis, MD  [facility] Cardiologist:  Rudean Haskell MD [place] Electrophysiologist:  None   CC:  Follow up Syncope  History of Present Illness:    Joel Villanueva is a 50 y.o. male with a hx of HTN, Vasovagal Syncope who presents for evaluation:  Passed out during his daughter's visit and seen 06/25/21.  In interim of this visit, patient had normal echocardiogram and ziopatch.  Patient notes that he is doing OK.  [place] visit notes no further syncopal episodes, but has not issues feeling uneasy.  Relevant interval testing or therapy include echo and ziopatch.  There are no interval hospital/ED visit.    No chest pain or pressure .  No SOB/DOE and no PND/Orthopnea.  No weight gain or leg swelling.  No palpitations.  Occasional dizziness Around 11:14 07/03/20 RBBB rate of 50.  Able to walk 4 days a week and goes fine.  Ambulatory blood pressure not done.   Past Medical History:  Diagnosis Date  . Hypertension   . Vasovagal episode     No past surgical history on file.  Current Medications: Current Meds  Medication Sig  . Cholecalciferol 25 MCG (1000 UT) capsule Take 1 capsule by mouth in the morning and at bedtime.  Marland Kitchen CINNAMON PO Take 1,000 mg by mouth in the morning and at bedtime.  Marland Kitchen lisinopril (PRINIVIL,ZESTRIL) 20 MG tablet Take 20 mg by mouth daily.  . Multiple Vitamins-Minerals (THERA-M) TABS Take 1 tablet by mouth daily.  . Omega-3 Fatty Acids (FISH OIL) 1000 MG CAPS Take 1 capsule by mouth in the morning and at bedtime.  Marland Kitchen OVER THE COUNTER MEDICATION 600 mg in the morning and at bedtime. Beet Root 600mg      Allergies:   Penicillins   Social History   Socioeconomic History  . Marital status: Single    Spouse name: Not on file  . Number of children: Not on file  . Years of education: Not on file  . Highest education level: Not  on file  Occupational History  . Not on file  Tobacco Use  . Smoking status: Never Smoker  . Smokeless tobacco: Never Used  Substance and Sexual Activity  . Alcohol use: Yes  . Drug use: No  . Sexual activity: Not on file  Other Topics Concern  . Not on file  Social History Narrative  . Not on file   Social Determinants of Health   Financial Resource Strain: Not on file  Food Insecurity: Not on file  Transportation Needs: Not on file  Physical Activity: Not on file  Stress: Not on file  Social Connections: Not on file     Family History: History of coronary artery disease notable for grandfather with MI. History of heart failure notable for no members. History of arrhythmia notable for no members.  ROS:   Please see the history of present illness.    Notes toe pain.  All other systems reviewed and are negative.  EKGs/Labs/Other Studies Reviewed:    The following studies were reviewed today:  EKG:   09/13/20:  SR 69 RBBB 06/25/20: SR 75 RBBB with nonspecific TWI   OSH Stress Echo Stress Testing: Date: 03/10/19 Results: Normal EF and no Ischemia; cannot see full study  Cardiac Event Monitoring: Date: 08/02/20 Results:  Patient had a minimum heart rate of 49 bpm, maximum heart rate  of 157 bpm, and average heart rate of 72 bpm.  Predominant underlying rhythm was sinus rhythm.  Isolated PACs were rare (<1.0%).  Isolated PVCs were rare (<1.0%).  No evidence of complete heart block or atrial fibrillation.  Triggered and diary events associated with sinus rhythm (recorded as accidental push and confirmed).   No malignant arrhythmias.   Transthoracic Echocardiogram: Date: 08/02/20 Results: IMPRESSIONS  1. Left ventricular ejection fraction, by estimation, is 65 to 70%. The  left ventricle has normal function. The left ventricle has no regional  wall motion abnormalities. Left ventricular diastolic parameters were  normal.  2. Right ventricular systolic  function is normal. The right ventricular  size is normal. There is normal pulmonary artery systolic pressure.  3. The mitral valve is normal in structure. Mild mitral valve  regurgitation. No evidence of mitral stenosis.  4. The aortic valve is normal in structure. Aortic valve regurgitation is  not visualized. No aortic stenosis is present.  5. The inferior vena cava is normal in size with greater than 50%  respiratory variability, suggesting right atrial pressure of 3 mmHg.    Recent Labs: No results found for requested labs within last 8760 hours.  Recent Lipid Panel No results found for: CHOL, TRIG, HDL, CHOLHDL, VLDL, LDLCALC, LDLDIRECT   Risk Assessment/Calculations:     N/A  Physical Exam:    VS:  BP (!) 144/89   Pulse 68   Ht 5\' 11"  (1.803 m)   Wt 232 lb (105.2 kg)   SpO2 97%   BMI 32.36 kg/m     Orthostatic Vitals: Supine:  BP 141/86  HR 67 Sitting:  BP 138/83  HR 65 Standing:  BP 127/81  HR 65 Prolonged Stand:  BP 140/87  HR 71   Wt Readings from Last 3 Encounters:  09/13/20 232 lb (105.2 kg)  06/25/20 230 lb (104.3 kg)  12/08/15 238 lb (108 kg)    GEN:  Well nourished, well developed in no acute distress HEENT: Normal NECK: No JVD; No carotid bruits LYMPHATICS: No lymphadenopathy CARDIAC: RRR, no murmurs, rubs, gallops RESPIRATORY:  Clear to auscultation without rales, wheezing or rhonchi  ABDOMEN: Soft, non-tender, non-distended MUSCULOSKELETAL:  No edema; No deformity Big toe non tophaceous and not suggestive of gout SKIN: Warm and dry NEUROLOGIC:  Alert and oriented x 3 PSYCHIATRIC:  Normal affect   ASSESSMENT:    1. Orthostatic hypotension   2. Essential hypertension   3. RBBB   4. Preventative health care    PLAN:    In order of problems listed above:  Orthostatic Hypotension HTN RBBB - asymptomatic - stress the importance of salt and water intake - gave education on slow rise, Valsalva maneuver exacerbation, temperature  change - discussed muscle contraction and leg crossing - gave exercise information; discussed salt and sodium  - offered compression stockings and abdominal binders - Discussed bystander assistance with near syncope  Cardiac Preventive Visit AHA Life's Simple Seven- People with at least five ideal Life's Simple 7 metrics had a 78% reduced risk for heart-related death compared to people with no ideal metrics. - Discussed recommendation of Mediterranean and Dash Diets; discussed the evidence behind vegan diets - Discussed recommendations for 150 minutes weekly exercise - Discussed the important of blood sugar and blood pressure control - offered CAC scoring and aggressive cholesterol management - stress the important of a smoke free environment - though BMI is not a perfect measurement in all patient populations; a BMI < 30 can improve cardiac  outcomes; present BMI is 32  Time Spent Directly with Patient:   I have spent a total of 40 minutes with the patient reviewing  notes, studies, EKGs, labs and examining the patient as well as establishing an assessment and plan that was discussed personally with the patient.  > 50% of time was spent in direct patient care:  Discussed exercise and diet with [place]; gave exercise program and discussions.  One year follow up unless new symptoms or abnormal test results warranting change in plan  Would be reasonable for  APP Follow up   Medication Adjustments/Labs and Tests Ordered: Current medicines are reviewed at length with the patient today.  Concerns regarding medicines are outlined above.  Orders Placed This Encounter  Procedures  . CT CARDIAC SCORING (SELF PAY ONLY)   No orders of the defined types were placed in this encounter.   Patient Instructions  Medication Instructions:  Your physician recommends that you continue on your current medications as directed. Please refer to the Current Medication list given to you today.  *If you need a  refill on your cardiac medications before your next appointment, please call your pharmacy*   Lab Work: NONE If you have labs (blood work) drawn today and your tests are completely normal, you will receive your results only by: Marland Kitchen MyChart Message (if you have MyChart) OR . A paper copy in the mail If you have any lab test that is abnormal or we need to change your treatment, we will call you to review the results.   Testing/Procedures: Your Physician has requested that you have a Coronary Calcium Score Test.    Follow-Up: At [facility], you and your health needs are our priority.  As part of our continuing mission to provide you with exceptional heart care, we have created designated Provider Care Teams.  These Care Teams include your primary Cardiologist (physician) and Advanced Practice Providers (APPs -  Physician Assistants and Nurse Practitioners) who all work together to provide you with the care you need, when you need it.  We recommend signing up for the patient portal called "MyChart".  Sign up information is provided on this After Visit Summary.  MyChart is used to connect with patients for Virtual Visits (Telemedicine).  Patients are able to view lab/test results, encounter notes, upcoming appointments, etc.  Non-urgent messages can be sent to your provider as well.   To learn more about what you can do with MyChart, go to [email or web address].    Your next appointment:   12 month(s)  The format for your next appointment:   In Person  Provider:   You may see Gasper Sells, MD or one of the following Advanced Practice Providers on your designated Care Team:    Melina Copa, PA-C  Ermalinda Barrios, PA-C          Signed, Werner Lean, MD  09/13/2020 11:20 AM    [place]

## 2020-09-13 NOTE — Patient Instructions (Signed)
Medication Instructions:  Your physician recommends that you continue on your current medications as directed. Please refer to the Current Medication list given to you today.  *If you need a refill on your cardiac medications before your next appointment, please call your pharmacy*   Lab Work: NONE If you have labs (blood work) drawn today and your tests are completely normal, you will receive your results only by: Marland Kitchen MyChart Message (if you have MyChart) OR . A paper copy in the mail If you have any lab test that is abnormal or we need to change your treatment, we will call you to review the results.   Testing/Procedures: Your Physician has requested that you have a Coronary Calcium Score Test.    Follow-Up: At [facility], you and your health needs are our priority.  As part of our continuing mission to provide you with exceptional heart care, we have created designated Provider Care Teams.  These Care Teams include your primary Cardiologist (physician) and Advanced Practice Providers (APPs -  Physician Assistants and Nurse Practitioners) who all work together to provide you with the care you need, when you need it.  We recommend signing up for the patient portal called "MyChart".  Sign up information is provided on this After Visit Summary.  MyChart is used to connect with patients for Virtual Visits (Telemedicine).  Patients are able to view lab/test results, encounter notes, upcoming appointments, etc.  Non-urgent messages can be sent to your provider as well.   To learn more about what you can do with MyChart, go to [email or web address].    Your next appointment:   12 month(s)  The format for your next appointment:   In Person  Provider:   You may see Gasper Sells, MD or one of the following Advanced Practice Providers on your designated Care Team:    Melina Copa, PA-C  Ermalinda Barrios, PA-C

## 2020-10-06 ENCOUNTER — Other Ambulatory Visit: Payer: Self-pay

## 2020-10-06 ENCOUNTER — Ambulatory Visit (INDEPENDENT_AMBULATORY_CARE_PROVIDER_SITE_OTHER)
Admission: RE | Admit: 2020-10-06 | Discharge: 2020-10-06 | Disposition: A | Discharge status: Home / Self Care | Payer: Self-pay | Source: Ambulatory Visit | Attending: Internal Medicine | Admitting: Internal Medicine

## 2020-10-06 DIAGNOSIS — I10 Essential (primary) hypertension: Secondary | ICD-10-CM

## 2020-10-13 ENCOUNTER — Telehealth: Payer: Self-pay | Admitting: Internal Medicine

## 2020-10-13 DIAGNOSIS — I2510 Atherosclerotic heart disease of native coronary artery without angina pectoris: Secondary | ICD-10-CM

## 2020-10-13 NOTE — Addendum Note (Signed)
Addended by: Stanton Kidney on: 10/13/2020 03:07 PM   Modules accepted: Orders

## 2020-10-13 NOTE — Telephone Encounter (Signed)
Discussed CT result. Pt scheduled for fasting lipid blood work next Monday. He is aware to be fasting for this blood work (at least 6-8 hours).

## 2020-10-13 NOTE — Telephone Encounter (Signed)
Pt is returning a call to [facility], he thinks it is to schedule a Lipid Test but he's unsure. Please Advise

## 2020-10-18 ENCOUNTER — Other Ambulatory Visit: Payer: 59 | Admitting: *Deleted

## 2020-10-18 ENCOUNTER — Other Ambulatory Visit: Payer: Self-pay

## 2020-10-18 DIAGNOSIS — I2510 Atherosclerotic heart disease of native coronary artery without angina pectoris: Secondary | ICD-10-CM

## 2020-10-19 LAB — LIPID PANEL
Chol/HDL Ratio: 6 ratio — ABNORMAL HIGH (ref 0.0–5.0)
Cholesterol, Total: 240 mg/dL — ABNORMAL HIGH (ref 100–199)
HDL: 40 mg/dL (ref >39)
LDL Chol Calc (NIH): 180 mg/dL — ABNORMAL HIGH (ref 0–99)
Triglycerides: 110 mg/dL (ref 0–149)
VLDL Cholesterol Cal: 20 mg/dL (ref 5–40)

## 2021-08-25 DIAGNOSIS — D225 Melanocytic nevi of trunk: Secondary | ICD-10-CM | POA: Diagnosis not present

## 2021-08-25 DIAGNOSIS — D2261 Melanocytic nevi of right upper limb, including shoulder: Secondary | ICD-10-CM | POA: Diagnosis not present

## 2021-08-25 DIAGNOSIS — C4359 Malignant melanoma of other part of trunk: Secondary | ICD-10-CM | POA: Diagnosis not present

## 2021-08-25 DIAGNOSIS — L570 Actinic keratosis: Secondary | ICD-10-CM | POA: Diagnosis not present

## 2021-08-25 DIAGNOSIS — L821 Other seborrheic keratosis: Secondary | ICD-10-CM | POA: Diagnosis not present

## 2021-08-25 DIAGNOSIS — L920 Granuloma annulare: Secondary | ICD-10-CM | POA: Diagnosis not present

## 2021-09-08 ENCOUNTER — Other Ambulatory Visit: Payer: Self-pay | Admitting: General Surgery

## 2021-09-08 DIAGNOSIS — C4359 Malignant melanoma of other part of trunk: Secondary | ICD-10-CM

## 2021-09-09 DIAGNOSIS — Z87898 Personal history of other specified conditions: Secondary | ICD-10-CM | POA: Diagnosis not present

## 2021-09-09 DIAGNOSIS — C4359 Malignant melanoma of other part of trunk: Secondary | ICD-10-CM | POA: Diagnosis not present

## 2021-09-13 NOTE — Pre-Procedure Instructions (Signed)
Surgical Instructions ? ? ? Your procedure is scheduled on Wednesday 09/21/21. ? ? Report to [facility] Main Entrance "A" at 1:00 P.M., then check in with the Admitting office. ? Call this number if you have problems the morning of surgery: ? [phone] ? ? If you have any questions prior to your surgery date call [phone]: Open Monday-Friday 8am-4pm ? ? ? Remember: ? Do not eat after midnight the night before your surgery ? ?You may drink clear liquids until 12:00 P.M. the morning of your surgery.   ?Clear liquids allowed are: Water, Non-Citrus Juices (without pulp), Carbonated Beverages, Clear Tea, Black Coffee ONLY (NO MILK, CREAM OR POWDERED CREAMER of any kind), and Gatorade ?  ? Take these medicines the morning of surgery with A SIP OF WATER:  ? NONE ? ? ?As of today, STOP taking any Aspirin (unless otherwise instructed by your surgeon) Aleve, Naproxen, Ibuprofen, Motrin, Advil, Goody's, BC's, all herbal medications, fish oil, and all vitamins. ? ?         ?Do not wear jewelry or makeup ?Do not wear lotions, powders, perfumes/colognes, or deodorant. ?Do not shave 48 hours prior to surgery.  Men may shave face and neck. ?Do not bring valuables to the hospital. ?Do not wear nail polish, gel polish, artificial nails, or any other type of covering on natural nails (fingers and toes) ?If you have artificial nails or gel coating that need to be removed by a nail salon, please have this removed prior to surgery. Artificial nails or gel coating may interfere with anesthesia's ability to adequately monitor your vital signs. ? ?Agency is not responsible for any belongings or valuables. .  ? ?Do NOT Smoke (Tobacco/Vaping)  24 hours prior to your procedure ? ?If you use a CPAP at night, you may bring your mask for your overnight stay. ?  ?Contacts, glasses, hearing aids, dentures or partials may not be worn into surgery, please bring cases for these belongings ?  ?For patients admitted to the hospital, discharge  time will be determined by your treatment team. ?  ?Patients discharged the day of surgery will not be allowed to drive home, and someone needs to stay with them for 24 hours. ? ?NO VISITORS WILL BE ALLOWED IN PRE-OP WHERE PATIENTS ARE PREPPED FOR SURGERY.  ONLY 1 SUPPORT PERSON MAY BE PRESENT IN THE WAITING ROOM WHILE YOU ARE IN SURGERY.  IF YOU ARE TO BE ADMITTED, ONCE YOU ARE IN YOUR ROOM YOU WILL BE ALLOWED TWO (2) VISITORS. 1 (ONE) VISITOR MAY STAY OVERNIGHT BUT MUST ARRIVE TO THE ROOM BY 8pm.  Minor children may have two parents present. Special consideration for safety and communication needs will be reviewed on a case by case basis. ? ?Special instructions:   ? ?Oral Hygiene is also important to reduce your risk of infection.  Remember - BRUSH YOUR TEETH THE MORNING OF SURGERY WITH YOUR REGULAR TOOTHPASTE ? ? ?[place]- Preparing For Surgery ? ?Before surgery, you can play an important role. Because skin is not sterile, your skin needs to be as free of germs as possible. You can reduce the number of germs on your skin by washing with CHG (chlorahexidine gluconate) Soap before surgery.  CHG is an antiseptic cleaner which kills germs and bonds with the skin to continue killing germs even after washing.   ? ? ?Please do not use if you have an allergy to CHG or antibacterial soaps. If your skin becomes reddened/irritated stop using the CHG.  ?  Do not shave (including legs and underarms) for at least 48 hours prior to first CHG shower. It is OK to shave your face. ? ?Please follow these instructions carefully. ?  ? ? Shower the NIGHT BEFORE SURGERY and the MORNING OF SURGERY with CHG Soap.  ? If you chose to wash your hair, wash your hair first as usual with your normal shampoo. After you shampoo, rinse your hair and body thoroughly to remove the shampoo.  Then [employer] and genitals (private parts) with your normal soap and rinse thoroughly to remove soap. ? ?After that Use CHG Soap as you would any other  liquid soap. You can apply CHG directly to the skin and wash gently with a scrungie or a clean washcloth.  ? ?Apply the CHG Soap to your body ONLY FROM THE NECK DOWN.  Do not use on open wounds or open sores. Avoid contact with your eyes, ears, mouth and genitals (private parts). Wash Face and genitals (private parts)  with your normal soap.  ? ?Wash thoroughly, paying special attention to the area where your surgery will be performed. ? ?Thoroughly rinse your body with warm water from the neck down. ? ?DO NOT shower/wash with your normal soap after using and rinsing off the CHG Soap. ? ?Pat yourself dry with a CLEAN TOWEL. ? ?Wear CLEAN PAJAMAS to bed the night before surgery ? ?Place CLEAN SHEETS on your bed the night before your surgery ? ?DO NOT SLEEP WITH PETS. ? ? ?Day of Surgery: ? ?Take a shower with CHG soap. ?Wear Clean/Comfortable clothing the morning of surgery ?Do not apply any deodorants/lotions.   ?Remember to brush your teeth WITH YOUR REGULAR TOOTHPASTE. ? ? ? ?COVID testing ? ?If you are going to stay overnight or be admitted after your procedure/surgery and require a pre-op COVID test, please follow these instructions after your COVID test  ? ?You are not required to quarantine however you are required to wear a well-fitting mask when you are out and around people not in your household.  If your mask becomes wet or soiled, replace with a new one. ? ?Wash your hands often with soap and water for 20 seconds or clean your hands with an alcohol-based hand sanitizer that contains at least 60% alcohol. ? ?Do not share personal items. ? ?Notify your provider: ?if you are in close contact with someone who has COVID  ?or if you develop a fever of 100.4 or greater, sneezing, cough, sore throat, shortness of breath or body aches. ? ?  ?Please read over the following fact sheets that you were given.  ? ?

## 2021-09-14 ENCOUNTER — Encounter (HOSPITAL_COMMUNITY): Payer: Self-pay

## 2021-09-14 ENCOUNTER — Encounter (HOSPITAL_COMMUNITY)
Admission: RE | Admit: 2021-09-14 | Discharge: 2021-09-14 | Disposition: A | Discharge status: Home / Self Care | Payer: BC Managed Care – PPO | Source: Ambulatory Visit | Attending: General Surgery | Admitting: General Surgery

## 2021-09-14 ENCOUNTER — Other Ambulatory Visit: Payer: Self-pay

## 2021-09-14 VITALS — BP 172/90 | HR 72 | Temp 98.2°F | Resp 18 | Ht 71.0 in | Wt 236.9 lb

## 2021-09-14 DIAGNOSIS — I493 Ventricular premature depolarization: Secondary | ICD-10-CM | POA: Diagnosis not present

## 2021-09-14 DIAGNOSIS — Z01818 Encounter for other preprocedural examination: Secondary | ICD-10-CM | POA: Diagnosis not present

## 2021-09-14 DIAGNOSIS — I951 Orthostatic hypotension: Secondary | ICD-10-CM | POA: Diagnosis not present

## 2021-09-14 DIAGNOSIS — I4510 Unspecified right bundle-branch block: Secondary | ICD-10-CM | POA: Insufficient documentation

## 2021-09-14 DIAGNOSIS — I491 Atrial premature depolarization: Secondary | ICD-10-CM | POA: Diagnosis not present

## 2021-09-14 DIAGNOSIS — I10 Essential (primary) hypertension: Secondary | ICD-10-CM | POA: Insufficient documentation

## 2021-09-14 HISTORY — DX: Malignant (primary) neoplasm, unspecified: C80.1

## 2021-09-14 LAB — BASIC METABOLIC PANEL
Anion gap: 9 (ref 5–15)
BUN: 14 mg/dL (ref 6–20)
CO2: 25 mmol/L (ref 22–32)
Calcium: 9.8 mg/dL (ref 8.9–10.3)
Chloride: 103 mmol/L (ref 98–111)
Creatinine, Ser: 1 mg/dL (ref 0.61–1.24)
GFR, Estimated: >60 mL/min (ref >60)
Glucose, Bld: 102 mg/dL — ABNORMAL HIGH (ref 70–99)
Potassium: 4.5 mmol/L (ref 3.5–5.1)
Sodium: 137 mmol/L (ref 135–145)

## 2021-09-14 LAB — CBC
HCT: 47.6 % (ref 39.0–52.0)
Hemoglobin: 16.5 g/dL (ref 13.0–17.0)
MCH: 32.5 pg (ref 26.0–34.0)
MCHC: 34.7 g/dL (ref 30.0–36.0)
MCV: 93.9 fL (ref 80.0–100.0)
Platelets: 332 10*3/uL (ref 150–400)
RBC: 5.07 MIL/uL (ref 4.22–5.81)
RDW: 12.5 % (ref 11.5–15.5)
WBC: 6.7 10*3/uL (ref 4.0–10.5)
nRBC: 0 % (ref 0.0–0.2)

## 2021-09-14 NOTE — Progress Notes (Signed)
PCP: Dwaine Gale, MD ?Cardiologist: Dr. Gasper Sells, MD ? ?EKG: 09/14/21 ?CXR: na ?ECHO:08/02/20 ?Stress Test: 03/10/19 and 03/08/16.  Records requested for 8/2 stress test from [facility] in [place] ?Cardiac Cath: denies ? ?Fasting Blood Sugar- na ?Checks Blood Sugar__na_ times a day ? ?ASA/Blood Thinner: No ? ?OSA/CPAP: No ? ?Covid test not needed - ambulatory ? ?Anesthesia Review: yes, cardiac records requested.  Orthostatic issues ? ?Patient denies shortness of breath, fever, cough, and chest pain at PAT appointment. ? ?Patient verbalized understanding of instructions provided today at the PAT appointment.  Patient asked to review instructions at home and day of surgery.   ?

## 2021-09-15 NOTE — Progress Notes (Signed)
Anesthesia Chart Review: ? ?Patient evaluated by cardiology last year for history of HTN, right bundle branch block, vasovagal syncope.  Event monitor 08/02/2020 showed underlying rhythm to be sinus, rare PACs and PVCs, no evidence of CHB or A-fib, no malignant arrhythmias.  Echo 08/02/2020 showed EF 65 to 70% no significant valvular abnormalities.  Of note, patient did have a stress echo at [facility] 03/10/2019.  Full results are not available in Care Everywhere however, Dr. Gasper Sells commented in his note 09/13/2020 that the test showed normal EF and no ischemia.  He was last seen by Dr. Gasper Sells 09/13/2020.  At that time he was educated on prevention/management of orthostatic hypotension.  Advised follow-up in 1 year. ? ?Preop labs reviewed, unremarkable. ? ?EKG 09/14/2021: NSR.  Rate 71.  Right bundle branch block. ? ?CT cardiac scoring 10/06/2020: ?FINDINGS: ?Coronary arteries: Normal origins. ?  ?Coronary Calcium Score: ?  ?Left main: 0 ?  ?Left anterior descending artery: 0 ?  ?Left circumflex artery: 0 ?  ?Right coronary artery: 27 ?  ?Total: 27 ?  ?Percentile: 81 ?  ?Pericardium: Normal. ?  ?Ascending Aorta: Normal caliber. ? ?Cardiac Event Monitoring: ?Date: 08/02/20 ?Results: ?Patient had a minimum heart rate of 49 bpm, maximum heart rate of 157 bpm, and average heart rate of 72 bpm. ?Predominant underlying rhythm was sinus rhythm. ?Isolated PACs were rare (<1.0%). ?Isolated PVCs were rare (<1.0%). ?No evidence of complete heart block or atrial fibrillation. ?Triggered and diary events associated with sinus rhythm (recorded as accidental push and confirmed). ?  ?No malignant arrhythmias. ?  ?Transthoracic Echocardiogram: ?Date: 08/02/20 ?Results: ?IMPRESSIONS  ? 1. Left ventricular ejection fraction, by estimation, is 65 to 70%. The  ?left ventricle has normal function. The left ventricle has no regional  ?wall motion abnormalities. Left ventricular diastolic parameters were  ?normal.  ? 2. Right ventricular  systolic function is normal. The right ventricular  ?size is normal. There is normal pulmonary artery systolic pressure.  ? 3. The mitral valve is normal in structure. Mild mitral valve  ?regurgitation. No evidence of mitral stenosis.  ? 4. The aortic valve is normal in structure. Aortic valve regurgitation is  ?not visualized. No aortic stenosis is present.  ? 5. The inferior vena cava is normal in size with greater than 50%  ?respiratory variability, suggesting right atrial pressure of 3 mmHg.  ? ? ? ? ?Karoline Caldwell, PA-C ?[facility] Short Stay Center/Anesthesiology ?Phone [phone] ?09/15/2021 11:59 AM ? ?

## 2021-09-15 NOTE — Anesthesia Preprocedure Evaluation (Signed)
Anesthesia Evaluation    Airway        Dental   Pulmonary           Cardiovascular hypertension,      Neuro/Psych    GI/Hepatic   Endo/Other    Renal/GU      Musculoskeletal   Abdominal   Peds  Hematology   Anesthesia Other Findings   Reproductive/Obstetrics                             Anesthesia Physical Anesthesia Plan  ASA:   Anesthesia Plan:    Post-op Pain Management:    Induction:   PONV Risk Score and Plan:   Airway Management Planned:   Additional Equipment:   Intra-op Plan:   Post-operative Plan:   Informed Consent:   Plan Discussed with:   Anesthesia Plan Comments: (PAT note by Karoline Caldwell, PA-C: Patient evaluated by cardiology last year for history of HTN, right bundle branch block, vasovagal syncope.  Event monitor 08/02/2020 showed underlying rhythm to be sinus, rare PACs and PVCs, no evidence of CHB or A-fib, no malignant arrhythmias.  Echo 08/02/2020 showed EF 65 to 70% no significant valvular abnormalities.  Of note, patient did have a stress echo at [facility] 03/10/2019.  Full results are not available in Care Everywhere however, Dr. Gasper Sells commented in his note 09/13/2020 that the test showed normal EF and no ischemia.  He was last seen by Dr. Gasper Sells 09/13/2020.  At that time he was educated on prevention/management of orthostatic hypotension.  Advised follow-up in 1 year.  Preop labs reviewed, unremarkable.  EKG 09/14/2021: NSR.  Rate 71.  Right bundle branch block.  CT cardiac scoring 10/06/2020: FINDINGS: Coronary arteries: Normal origins.  Coronary Calcium Score:  Left main: 0  Left anterior descending artery: 0  Left circumflex artery: 0  Right coronary artery: 27  Total: 27  Percentile: 81  Pericardium: Normal.  Ascending Aorta: Normal caliber.  Cardiac Event Monitoring: Date:08/02/20 Results:  Patient had a minimum heart  rate of 49 bpm, maximum heart rate of 157 bpm, and average heart rate of 72 bpm.  Predominant underlying rhythm was sinus rhythm.  Isolated PACs were rare (<1.0%).  Isolated PVCs were rare (<1.0%).  No evidence of complete heart block or atrial fibrillation.  Triggered and diary events associated with sinus rhythm (recorded as accidental pushand confirmed).  No malignant arrhythmias.  Transthoracic Echocardiogram: Date:08/02/20 Results: IMPRESSIONS  1. Left ventricular ejection fraction, by estimation, is 65 to 70%. The  left ventricle has normal function. The left ventricle has no regional  wall motion abnormalities. Left ventricular diastolic parameters were  normal.  2. Right ventricular systolic function is normal. The right ventricular  size is normal. There is normal pulmonary artery systolic pressure.  3. The mitral valve is normal in structure. Mild mitral valve  regurgitation. No evidence of mitral stenosis.  4. The aortic valve is normal in structure. Aortic valve regurgitation is  not visualized. No aortic stenosis is present.  5. The inferior vena cava is normal in size with greater than 50%  respiratory variability, suggesting right atrial pressure of 3 mmHg.   )        Anesthesia Quick Evaluation

## 2021-09-21 ENCOUNTER — Ambulatory Visit (HOSPITAL_COMMUNITY)
Admission: RE | Admit: 2021-09-21 | Discharge: 2021-09-21 | Disposition: A | Discharge status: Home / Self Care | Payer: BC Managed Care – PPO | Attending: General Surgery | Admitting: General Surgery

## 2021-09-21 ENCOUNTER — Other Ambulatory Visit: Payer: Self-pay

## 2021-09-21 ENCOUNTER — Ambulatory Visit (HOSPITAL_COMMUNITY): Payer: BC Managed Care – PPO | Admitting: Physician Assistant

## 2021-09-21 ENCOUNTER — Ambulatory Visit (HOSPITAL_COMMUNITY): Payer: BC Managed Care – PPO | Admitting: Anesthesiology

## 2021-09-21 ENCOUNTER — Encounter (HOSPITAL_COMMUNITY): Payer: Self-pay | Admitting: General Surgery

## 2021-09-21 ENCOUNTER — Encounter (HOSPITAL_COMMUNITY)
Admission: RE | Admit: 2021-09-21 | Discharge: 2021-09-21 | Disposition: A | Discharge status: Home / Self Care | Payer: BC Managed Care – PPO | Source: Ambulatory Visit | Attending: General Surgery | Admitting: General Surgery

## 2021-09-21 ENCOUNTER — Encounter (HOSPITAL_COMMUNITY)
Admission: RE | Disposition: A | Discharge status: Home / Self Care | Payer: Self-pay | Source: Home / Self Care | Attending: General Surgery

## 2021-09-21 DIAGNOSIS — I951 Orthostatic hypotension: Secondary | ICD-10-CM | POA: Insufficient documentation

## 2021-09-21 DIAGNOSIS — C4359 Malignant melanoma of other part of trunk: Secondary | ICD-10-CM

## 2021-09-21 DIAGNOSIS — I10 Essential (primary) hypertension: Secondary | ICD-10-CM | POA: Diagnosis not present

## 2021-09-21 SURGERY — EXCISION, MELANOMA, WITH SENTINEL LYMPH NODE BIOPSY
Anesthesia: General | Site: Back | Laterality: Left

## 2021-09-21 MED ORDER — METHYLENE BLUE 1 % INJ SOLN
INTRAVENOUS | Status: DC | PRN
  Administered 2021-09-21: 1 mL via SUBMUCOSAL

## 2021-09-21 MED ORDER — METHYLENE BLUE 0.5 % INJ SOLN
INTRAVENOUS | Status: AC
  Filled 2021-09-21: qty 10

## 2021-09-21 MED ORDER — LACTATED RINGERS IV SOLN: INTRAVENOUS | Status: DC

## 2021-09-21 MED ORDER — 0.9 % SODIUM CHLORIDE (POUR BTL) OPTIME
TOPICAL | Status: DC | PRN
  Administered 2021-09-21: 1000 mL

## 2021-09-21 MED ORDER — FENTANYL CITRATE (PF) 250 MCG/5ML IJ SOLN
INTRAMUSCULAR | Status: DC | PRN
  Administered 2021-09-21 (×2): 100 ug via INTRAVENOUS
  Administered 2021-09-21: 50 ug via INTRAVENOUS

## 2021-09-21 MED ORDER — CHLORHEXIDINE GLUCONATE CLOTH 2 % EX PADS: 6.0000 | MEDICATED_PAD | Freq: Once | CUTANEOUS | Status: DC

## 2021-09-21 MED ORDER — TECHNETIUM TC 99M TILMANOCEPT KIT
0.5000 | PACK | Freq: Once | INTRAVENOUS | Status: AC | PRN
  Administered 2021-09-21: 0.5 via INTRADERMAL

## 2021-09-21 MED ORDER — LIDOCAINE HCL (PF) 1 % IJ SOLN
INTRAMUSCULAR | Status: AC
  Filled 2021-09-21: qty 30

## 2021-09-21 MED ORDER — OXYCODONE HCL 5 MG PO TABS: 5.0000 mg | ORAL_TABLET | Freq: Once | ORAL | Status: DC | PRN

## 2021-09-21 MED ORDER — ROCURONIUM BROMIDE 10 MG/ML (PF) SYRINGE
PREFILLED_SYRINGE | INTRAVENOUS | Status: DC | PRN
  Administered 2021-09-21 (×2): 20 mg via INTRAVENOUS
  Administered 2021-09-21: 60 mg via INTRAVENOUS

## 2021-09-21 MED ORDER — FENTANYL CITRATE (PF) 100 MCG/2ML IJ SOLN: 25.0000 ug | INTRAMUSCULAR | Status: DC | PRN

## 2021-09-21 MED ORDER — DEXAMETHASONE SODIUM PHOSPHATE 10 MG/ML IJ SOLN
INTRAMUSCULAR | Status: DC | PRN
  Administered 2021-09-21: 10 mg via INTRAVENOUS

## 2021-09-21 MED ORDER — ORAL CARE MOUTH RINSE: 15.0000 mL | Freq: Once | OROMUCOSAL | Status: AC

## 2021-09-21 MED ORDER — SODIUM CHLORIDE (PF) 0.9 % IJ SOLN
INTRAMUSCULAR | Status: AC
  Filled 2021-09-21: qty 10

## 2021-09-21 MED ORDER — CIPROFLOXACIN IN D5W 400 MG/200ML IV SOLN
400.0000 mg | INTRAVENOUS | Status: AC
  Administered 2021-09-21: 400 mg via INTRAVENOUS
  Filled 2021-09-21: qty 200

## 2021-09-21 MED ORDER — ACETAMINOPHEN 500 MG PO TABS
1000.0000 mg | ORAL_TABLET | ORAL | Status: AC
  Administered 2021-09-21: 1000 mg via ORAL
  Filled 2021-09-21: qty 2

## 2021-09-21 MED ORDER — BUPIVACAINE-EPINEPHRINE (PF) 0.25% -1:200000 IJ SOLN
INTRAMUSCULAR | Status: AC
  Filled 2021-09-21: qty 30

## 2021-09-21 MED ORDER — LIDOCAINE HCL 1 % IJ SOLN
INTRAMUSCULAR | Status: DC | PRN
  Administered 2021-09-21: 40 mL

## 2021-09-21 MED ORDER — OXYCODONE HCL 5 MG PO TABS: 5.0000 mg | ORAL_TABLET | Freq: Four times a day (QID) | ORAL | 0 refills | Status: AC | PRN

## 2021-09-21 MED ORDER — SUGAMMADEX SODIUM 200 MG/2ML IV SOLN
INTRAVENOUS | Status: DC | PRN
  Administered 2021-09-21: 200 mg via INTRAVENOUS

## 2021-09-21 MED ORDER — MIDAZOLAM HCL 2 MG/2ML IJ SOLN
INTRAMUSCULAR | Status: DC | PRN
  Administered 2021-09-21: 2 mg via INTRAVENOUS

## 2021-09-21 MED ORDER — MIDAZOLAM HCL 2 MG/2ML IJ SOLN
INTRAMUSCULAR | Status: AC
  Filled 2021-09-21: qty 2

## 2021-09-21 MED ORDER — GABAPENTIN 300 MG PO CAPS
300.0000 mg | ORAL_CAPSULE | ORAL | Status: AC
  Administered 2021-09-21: 300 mg via ORAL
  Filled 2021-09-21: qty 1

## 2021-09-21 MED ORDER — FENTANYL CITRATE (PF) 250 MCG/5ML IJ SOLN
INTRAMUSCULAR | Status: AC
  Filled 2021-09-21: qty 5

## 2021-09-21 MED ORDER — ONDANSETRON HCL 4 MG/2ML IJ SOLN: 4.0000 mg | Freq: Once | INTRAMUSCULAR | Status: DC | PRN

## 2021-09-21 MED ORDER — OXYCODONE HCL 5 MG/5ML PO SOLN: 5.0000 mg | Freq: Once | ORAL | Status: DC | PRN

## 2021-09-21 MED ORDER — PROPOFOL 10 MG/ML IV BOLUS
INTRAVENOUS | Status: DC | PRN
  Administered 2021-09-21: 160 mg via INTRAVENOUS

## 2021-09-21 MED ORDER — LIDOCAINE 2% (20 MG/ML) 5 ML SYRINGE
INTRAMUSCULAR | Status: DC | PRN
Start: 2021-09-21 — End: 2021-09-21
  Administered 2021-09-21: 80 mg via INTRAVENOUS

## 2021-09-21 MED ORDER — CHLORHEXIDINE GLUCONATE 0.12 % MT SOLN
15.0000 mL | Freq: Once | OROMUCOSAL | Status: AC
  Administered 2021-09-21: 15 mL via OROMUCOSAL
  Filled 2021-09-21: qty 15

## 2021-09-21 SURGICAL SUPPLY — 60 items
BAG COUNTER SPONGE SURGICOUNT (BAG) ×2 IMPLANT
BLADE SURG 10 STRL SS (BLADE) ×2 IMPLANT
BNDG COHESIVE 4X5 TAN STRL (GAUZE/BANDAGES/DRESSINGS) ×2 IMPLANT
BNDG GAUZE ELAST 4 BULKY (GAUZE/BANDAGES/DRESSINGS) ×1 IMPLANT
CANISTER SUCT 3000ML PPV (MISCELLANEOUS) ×2 IMPLANT
CHLORAPREP W/TINT 26 (MISCELLANEOUS) ×3 IMPLANT
CLIP VESOCCLUDE MED 24/CT (CLIP) ×2 IMPLANT
CLIP VESOCCLUDE SM WIDE 24/CT (CLIP) ×1 IMPLANT
CNTNR URN SCR LID CUP LEK RST (MISCELLANEOUS) ×3 IMPLANT
CONT SPEC 4OZ STRL OR WHT (MISCELLANEOUS)
COVER MAYO STAND STRL (DRAPES) ×1 IMPLANT
COVER PROBE W GEL 5X96 (DRAPES) ×2 IMPLANT
COVER SURGICAL LIGHT HANDLE (MISCELLANEOUS) ×2 IMPLANT
DECANTER SPIKE VIAL GLASS SM (MISCELLANEOUS) ×1 IMPLANT
DERMABOND ADVANCED (GAUZE/BANDAGES/DRESSINGS) ×1
DERMABOND ADVANCED .7 DNX12 (GAUZE/BANDAGES/DRESSINGS) IMPLANT
DRAPE HALF SHEET 40X57 (DRAPES) ×2 IMPLANT
DRAPE LAPAROSCOPIC ABDOMINAL (DRAPES) ×2 IMPLANT
DRAPE UTILITY XL STRL (DRAPES) ×1 IMPLANT
DRSG TEGADERM 4X4.75 (GAUZE/BANDAGES/DRESSINGS) ×4 IMPLANT
ELECT REM PT RETURN 9FT ADLT (ELECTROSURGICAL) ×2
ELECTRODE REM PT RTRN 9FT ADLT (ELECTROSURGICAL) ×1 IMPLANT
GAUZE SPONGE 2X2 8PLY STRL LF (GAUZE/BANDAGES/DRESSINGS) ×1 IMPLANT
GAUZE SPONGE 4X4 12PLY STRL LF (GAUZE/BANDAGES/DRESSINGS) ×2 IMPLANT
GLOVE SURG ENC MOIS LTX SZ6 (GLOVE) ×1 IMPLANT
GLOVE SURG UNDER LTX SZ6.5 (GLOVE) ×1 IMPLANT
GOWN STRL REUS W/ TWL LRG LVL3 (GOWN DISPOSABLE) ×2 IMPLANT
GOWN STRL REUS W/TWL 2XL LVL3 (GOWN DISPOSABLE) ×4 IMPLANT
GOWN STRL REUS W/TWL LRG LVL3 (GOWN DISPOSABLE) ×4
KIT BASIN OR (CUSTOM PROCEDURE TRAY) ×2 IMPLANT
KIT TURNOVER KIT B (KITS) ×2 IMPLANT
LIGHT WAVEGUIDE WIDE FLAT (MISCELLANEOUS) ×1 IMPLANT
MARKER SKIN DUAL TIP RULER LAB (MISCELLANEOUS) ×2 IMPLANT
NDL 18GX1X1/2 (RX/OR ONLY) (NEEDLE) ×1 IMPLANT
NDL FILTER BLUNT 18X1 1/2 (NEEDLE) IMPLANT
NDL HYPO 25GX1X1/2 BEV (NEEDLE) ×1 IMPLANT
NEEDLE 18GX1X1/2 (RX/OR ONLY) (NEEDLE) IMPLANT
NEEDLE 22X1 1/2 (OR ONLY) (NEEDLE) ×2 IMPLANT
NEEDLE FILTER BLUNT 18X 1/2SAF (NEEDLE) ×1
NEEDLE FILTER BLUNT 18X1 1/2 (NEEDLE) ×1 IMPLANT
NEEDLE HYPO 25GX1X1/2 BEV (NEEDLE) ×4 IMPLANT
NS IRRIG 1000ML POUR BTL (IV SOLUTION) ×2 IMPLANT
PACK GENERAL/GYN (CUSTOM PROCEDURE TRAY) ×2 IMPLANT
PACK UNIVERSAL I (CUSTOM PROCEDURE TRAY) ×1 IMPLANT
PAD ARMBOARD 7.5X6 YLW CONV (MISCELLANEOUS) ×4 IMPLANT
PENCIL SMOKE EVACUATOR (MISCELLANEOUS) ×2 IMPLANT
SPECIMEN JAR MEDIUM (MISCELLANEOUS) ×1 IMPLANT
SPONGE GAUZE 2X2 STER 10/PKG (GAUZE/BANDAGES/DRESSINGS)
STOCKINETTE IMPERVIOUS 9X36 MD (GAUZE/BANDAGES/DRESSINGS) ×3 IMPLANT
STRIP CLOSURE SKIN 1/2X4 (GAUZE/BANDAGES/DRESSINGS) ×2 IMPLANT
SUT ETHILON 2 0 PSLX (SUTURE) ×2 IMPLANT
SUT MNCRL AB 4-0 PS2 18 (SUTURE) ×3 IMPLANT
SUT SILK 2 0 PERMA HAND 18 BK (SUTURE) ×2 IMPLANT
SUT VIC AB 2-0 SH 27 (SUTURE) ×3
SUT VIC AB 2-0 SH 27XBRD (SUTURE) ×2 IMPLANT
SUT VIC AB 3-0 SH 27 (SUTURE) ×2
SUT VIC AB 3-0 SH 27X BRD (SUTURE) ×2 IMPLANT
SYR CONTROL 10ML LL (SYRINGE) ×4 IMPLANT
TOWEL GREEN STERILE (TOWEL DISPOSABLE) ×2 IMPLANT
TOWEL GREEN STERILE FF (TOWEL DISPOSABLE) ×1 IMPLANT

## 2021-09-21 NOTE — Anesthesia Procedure Notes (Signed)
Procedure Name: Intubation ?Date/Time: 09/21/2021 2:59 PM ?Performed by: Leonor Liv, CRNA ?Pre-anesthesia Checklist: Patient identified, Emergency Drugs available, Suction available and Patient being monitored ?Patient Re-evaluated:Patient Re-evaluated prior to induction ?Oxygen Delivery Method: Circle System Utilized ?Preoxygenation: Pre-oxygenation with 100% oxygen ?Induction Type: IV induction ?Ventilation: Mask ventilation without difficulty ?Laryngoscope Size: Mac and 4 ?Grade View: Grade I ?Tube type: Oral ?Tube size: 7.5 mm ?Number of attempts: 1 ?Airway Equipment and Method: Stylet and Oral airway ?Placement Confirmation: ETT inserted through vocal cords under direct vision, positive ETCO2 and breath sounds checked- equal and bilateral ?Secured at: 23 cm ?Tube secured with: Tape ?Dental Injury: Teeth and Oropharynx as per pre-operative assessment  ? ? ? ? ?

## 2021-09-21 NOTE — Op Note (Addendum)
PRE-OPERATIVE DIAGNOSIS: [id number] left mid upper back melanoma ? ?POST-OPERATIVE DIAGNOSIS:  Same ? ?PROCEDURE:  Procedure(s): ?Wide local excision 1-2 cm margins, advancement flap closure for defect 6.8 x 3.8 cm, left axillary sentinel lymph node mapping and biopsy ? ?SURGEON:  Surgeon(s): ?Stark Klein, MD ? ?ASSIST:  ?Carlena Hurl, PA-C ? ?ANESTHESIA:   local and general ? ?DRAINS: none  ? ?LOCAL MEDICATIONS USED:  MARCAINE    and XYLOCAINE  ? ?SPECIMEN:  Source of Specimen: wide local excision left mid upper back melanoma,   two deep left axillary sentinel lymph nodes  ? ?FINDINGS:  no residual pigmented specimens present, SLN #1 cps 891, SLN #2 cps 85, background 20 ? ?DISPOSITION OF SPECIMEN:  PATHOLOGY ? ?COUNTS:  YES ? ?PLAN OF CARE: Discharge to home after PACU ? ?PATIENT DISPOSITION:  PACU - hemodynamically stable. ?  ? ?PROCEDURE:   ?Pt was identified in the holding area, taken to the OR, and placed supine on the OR table.  General anesthesia was induced.  Time out was performed according to the surgical safety checklist.  When all was correct, we continued.  The patient was placed into the right lateral decubitus position.  The left upper back was prepped and draped in sterile fashion.  One mL methylene blue was injected intradermally around the melanoma biopsy site.   ? ?The melanoma was identified and 1-2 cm margins were marked out.  Local was administered under the melanoma and the adjacent tissue.  A #10 blade was used to incise the skin around the melanoma.  The cautery was used to take the dissection down to the fascia.  The skin was marked in situ with orientation sutures.  The cautery was used to take the specimen off the fascia, and it was passed off the table.   ? ?Skin hooks were used to elevate the edges of the incision and the skin was freed up with the cautery in all directions to create advancement flaps.  This was pulled together in an oblique orientation. The skin was pulled together to  check the tension. Deep interrupted 2-0 vicryl sutures were placed to relieve tension.  The skin was then reapproximated with 3-0 interrupted vicryl deep dermal sutures and 4-0 monocryl running subcuticular sutures.  Four 2-0 nylon horizontal mattress sutures were placed as well.   ? ?The patient was then placed back supine.  The patient's left axilla, upper arm and upper chest were prepped and draped in sterile fashion.  The point of maximum signal intensity was identified with the neoprobe.  A 4 cm incision was made with a #15 blade.  The subcutaneous tissues were divided with the cautery.  A Weitlaner retractor was used to assist with visualization.  The tonsil clamp was used to bluntly dissect the axillary fat pad.  Two sentinel lymph nodes were identified as described above.  The lymphovascular channels were clipped with hemoclips.  The nodes were passed off as specimens.  Hemostasis was achieved with the cautery.  The axilla was irrigated and closed with 3-0 Vicryl deep dermal interrupted sutures and 4-0 Monocryl running subcuticular suture.  The axilla was dressed with dermabond.   ? ?The melanoma site was cleaned, dried, and dressed with Benzoin, steristrips, gauze, and tegaderm.   ? ?Needle, sponge, and instrument counts were correct.  The patient was awakened from anesthesia and taken to the PACU in stable condition.   ? ?

## 2021-09-21 NOTE — Transfer of Care (Signed)
Immediate Anesthesia Transfer of Care Note ? ?Patient: Joel Villanueva ? ?Procedure(s) Performed: WIDE LOCAL EXCISION WITH ADVANCED FLAP CLOSURE LEFT BACK MELANOMA WITH SENTINEL LYMPH NODE BIOPSY (Left: Back) ? ?Patient Location: PACU ? ?Anesthesia Type:General ? ?Level of Consciousness: oriented and drowsy ? ?Airway & Oxygen Therapy: Patient Spontanous Breathing and Patient connected to nasal cannula oxygen ? ?Post-op Assessment: Report given to RN and Post -op Vital signs reviewed and stable ? ?Post vital signs: Reviewed and stable ? ?Last Vitals:  ?Vitals Value Taken Time  ?BP 138/81 09/21/21 1717  ?Temp    ?Pulse 87 09/21/21 1718  ?Resp 13 09/21/21 1718  ?SpO2 96 % 09/21/21 1718  ?Vitals shown include unvalidated device data. ? ?Last Pain:  ?Vitals:  ? 09/21/21 1320  ?TempSrc:   ?PainSc: 0-No pain  ?   ? ?  ? ?Complications: No notable events documented. ?

## 2021-09-21 NOTE — Discharge Instructions (Addendum)
[facility] ?Office Phone Number [phone] ? ? POST OP INSTRUCTIONS ? ?Always review your discharge instruction sheet given to you by the facility where your surgery was performed. ? ?IF YOU HAVE DISABILITY OR FAMILY LEAVE FORMS, YOU MUST BRING THEM TO THE OFFICE FOR PROCESSING.  DO NOT GIVE THEM TO YOUR DOCTOR. ? ?Take 2 tylenol (acetominophen) three times a day for 3 days.  If you still have pain, add ibuprofen with food in between if able to take this (if you have kidney issues or stomach issues, do not take ibuprofen).  If both of those are not enough, add the narcotic pain pill.  If you find you are needing a lot of this overnight after surgery, call the next morning for a refill.   ?Take your usually prescribed medications unless otherwise directed ?If you need a refill on your pain medication, please contact your pharmacy.  They will contact our office to request authorization.  Prescriptions will not be filled after 5pm or on week-ends. ?You should eat very light the first 24 hours after surgery, such as soup, crackers, pudding, etc.  Resume your normal diet the day after surgery ?It is common to experience some constipation if taking pain medication after surgery.  Increasing fluid intake and taking a stool softener will usually help or prevent this problem from occurring.  A mild laxative (Milk of Magnesia or Miralax) should be taken according to package directions if there are no bowel movements after 48 hours. ?You may shower in 48 hours.  The surgical glue will flake off in 2-3 weeks.   ?ACTIVITIES:  No strenuous activity or heavy lifting for 1-2 weeks.   ?You may drive when you no longer are taking prescription pain medication, you can comfortably wear a seatbelt, and you can safely maneuver your car and apply brakes. ?RETURN TO WORK:  __________to be determined.  _______________ ?You should see your doctor in the office for a follow-up appointment approximately three-four weeks  after your surgery.   ? ?WHEN TO CALL YOUR DOCTOR: ?Fever over 101.0 ?Nausea and/or vomiting. ?Extreme swelling or bruising. ?Continued bleeding from incision. ?Increased pain, redness, or drainage from the incision. ? ?The clinic staff is available to answer your questions during regular business hours.  Please don?t hesitate to call and ask to speak to one of the nurses for clinical concerns.  If you have a medical emergency, go to the nearest emergency room or call 911.  A surgeon from [facility] is always on call at the hospital. ? ?For further questions, please visit [email or web address]  ? ?

## 2021-09-21 NOTE — Interval H&P Note (Signed)
History and Physical Interval Note: ? ?09/21/2021 ?2:17 PM ? ?Joel Villanueva  has presented today for surgery, with the diagnosis of left back melanoma.  The various methods of treatment have been discussed with the patient and family. After consideration of risks, benefits and other options for treatment, the patient has consented to  Procedure(s): ?WIDE LOCAL EXCISION WITH ADVANCED FLAP CLOSURE LEFT BACK MELANOMA WITH SENTINEL LYMPH NODE BIOPSY (Left) as a surgical intervention.  The patient's history has been reviewed, patient examined, no change in status, stable for surgery.  I have reviewed the patient's chart and labs.  Questions were answered to the patient's satisfaction.   ? ? ?Stark Klein ? ? ?

## 2021-09-21 NOTE — Anesthesia Postprocedure Evaluation (Signed)
Anesthesia Post Note ? ?Patient: [name] ? ?Procedure(s) Performed: WIDE LOCAL EXCISION WITH ADVANCED FLAP CLOSURE LEFT BACK MELANOMA WITH SENTINEL LYMPH NODE BIOPSY (Left: Back) ? ?  ? ?Patient location during evaluation: PACU ?Anesthesia Type: General ?Level of consciousness: awake and alert and oriented ?Pain management: pain level controlled ?Vital Signs Assessment: post-procedure vital signs reviewed and stable ?Respiratory status: spontaneous breathing, nonlabored ventilation and respiratory function stable ?Cardiovascular status: blood pressure returned to baseline and stable ?Postop Assessment: no apparent nausea or vomiting ?Anesthetic complications: no ? ? ?No notable events documented. ? ?Last Vitals:  ?Vitals:  ? 09/21/21 1330 09/21/21 1715  ?BP: (!) 176/94 138/81  ?Pulse:  76  ?Resp:  12  ?Temp:  36.6 ?C  ?SpO2:  99%  ?  ?Last Pain:  ?Vitals:  ? 09/21/21 1715  ?TempSrc:   ?PainSc: Asleep  ? ? ?  ?  ?  ?  ?  ?  ? ?Lenor Provencher A. ? ? ? ? ?

## 2021-09-21 NOTE — H&P (Signed)
? ? ?REFERRING PHYSICIAN: Sydnee Levans, MD ? ?PROVIDER: Georgianne Fick, MD ? ?MRN: [id number] ?DOB: 1971/05/16 ?DATE OF ENCOUNTER: 09/09/2021 ?Subjective  ? ?Chief Complaint: Melanoma - mid upper back ? ? ?History of Present Illness: ?Joel Villanueva is a 51 y.o. male who is seen today as an office consultation for evaluation of Melanoma - mid upper back ?.  ? ?Pt has a new diagnosis of malignant melanoma on his midline mid upper back 08/2021. He saw Dr. Fontaine No for a skin check and was seen to have an abnormal lesion on his back. This was biopsied and was seen to be a 1.6 mm melanoma. It had positive deep and peripheral margins. The biopsy showed no neurotropism, no LVI, no ulceration, no satellitosis, brisk TIL. He has no previous history of cancer before this.  ? ?Patient denies any family history of cancer. He is quite fair skinned and and has been seeing dermatology for around 5 years. He has not had any previous melanoma or melanoma in situ. ? 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 ? ?Of note, the patient has had several episodes of syncope in the past. Interestingly enough, the first episode he had while he was in the cardiologist office for his daughter. His daughter was being seen for an irregular heartbeat. After the patient passed out in the cardiology office, he ended up getting significant cardiac work-up. He had a stress test and a Holter monitor as well as other testing. Eventually he was diagnosed with orthostatic hypotension as no arrhythmia was seen and no evidence of ischemia was seen. He states that he had around 3-4 of these episodes and that all but one of them were following episodes of increased alcohol intake the night before. He has not had an episode since December 2021. ? ? ? ?Review of Systems: ?A  complete review of systems was obtained from the patient. I have reviewed this information and discussed as appropriate with the patient. See HPI as well for other ROS. ? ?Review of Systems  ?HENT: Positive for tinnitus.  ?Neurological: Positive for dizziness.  ?All other systems reviewed and are negative. ? ? ?Medical History: ?Past Medical History:  ?Diagnosis Date  ? Hyperlipidemia  ? Hypertension  ? ?Patient Active Problem List  ?Diagnosis  ? Malignant melanoma of back (CMS-HCC)  ? History of syncope  ? ?History reviewed. No pertinent surgical history.  ? ?Allergies  ?Allergen Reactions  ? Penicillins Unknown  ? ?Current Outpatient Medications on File Prior to Visit  ?Medication Sig Dispense Refill  ? lisinopriL (ZESTRIL) 20 MG tablet Take by mouth  ? cholecalciferol (VITAMIN D3) 1000 unit capsule Take 1 capsule by mouth once daily  ? docosahexaenoic acid-epa 120-180 mg Cap Take by mouth  ? multivitamin,tx-iron-minerals (THERA-M) Tab Take 1 tablet by mouth once daily  ? ?No current facility-administered medications on file prior to visit.  ? ?Family History  ?Problem Relation Age of Onset  ? Hyperlipidemia (Elevated cholesterol) Mother  ? High blood pressure (Hypertension) Mother  ? High blood pressure (Hypertension) Father  ? Hyperlipidemia (Elevated cholesterol) Father  ? Diabetes Maternal Grandmother  ? Stroke Maternal Grandmother  ? Coronary Artery Disease (Blocked arteries around heart) Maternal Grandfather  ? ? ?Social History  ? ?Tobacco Use  ?Smoking Status Never  ?Smokeless Tobacco Never  ? ? ?Social History  ? ?  Socioeconomic History  ? Marital status: Single  ?Tobacco Use  ? Smoking status: Never  ? Smokeless tobacco: Never  ?Substance and Sexual Activity  ? Alcohol use: Yes  ?Comment: 1 drink/day  ? Drug use: Never  ? ?Objective:  ? ?Vitals:  ?09/09/21 0936  ?BP: 130/82  ?Pulse: 89  ?Temp: 36.9 ?C (98.5 ?F)  ?SpO2: 98%  ?Weight: (!) 109 kg (240 lb 6.4 oz)  ?Height: 180.3 cm ('5\' 11"'$ )  ? ?Body mass  index is 33.53 kg/m?. ? ?Head: Normocephalic and atraumatic.  ?Mouth/Throat: Oropharynx is clear and moist. No oropharyngeal exudate.  ?Eyes: Conjunctivae are normal. Pupils are equal, round, and reactive to light. No scleral icterus.  ?Neck: Normal range of motion. Neck supple. No tracheal deviation present. No thyromegaly present.  ?Cardiovascular: Normal rate, regular rhythm, normal heart sounds and intact distal pulses. Exam reveals no gallop and no friction rub.  ?No murmur heard. ?Respiratory: Effort normal and breath sounds normal. No respiratory distress. No wheezes, rales or rhonchi. No chest wall tenderness.  ?GI: Soft. Bowel sounds are normal. Abdomen is soft, non tender, non distended. No masses or hepatosplenomegaly is present. There is no rebound and no guarding.  ?Musculoskeletal: . Extremities are non tender and without deformity.  ?Lymphadenopathy: No cervical or axillary adenopathy.  ?Neurological: Alert and oriented to person, place, and time. Coordination normal.  ?Skin: Patient has a scab around 8 mm in size on the mid upper back. This is around 3 cm off-center to the left. There is no residual pigmentation. I do not see any pigmented lesions surrounding this. Skin is warm and dry. No rash noted. No diaphoresis. No erythema. No pallor.  ?Psychiatric: Normal mood and affect.Behavior is normal. Judgment and thought content normal.  ? ?Labs, Imaging and Diagnostic Testing: ?None additional K ? ?Assessment and Plan:  ? ?Diagnoses and all orders for this visit: ? ?Malignant melanoma of back (CMS-HCC) ?Assessment & Plan: ?Joel plan wide local excision with advancement flap closure and SLN bx. ? ?Joel need pre op lymphoscintigraphy to assess if unilateral or bilateral drainage. This Joel likely go to the axilla based on the location on his back. ? ?I discussed surgery with the patient and the timeline of the lymphoscintigram and and the day of surgery nuclear medicine injection. I reviewed the risk of  bleeding, infection, wound breakdown, seroma, permanent numbness, heart or lung issues, possible need for additional procedures, blood clot, death. I also reviewed the risk of recurrent melanoma. ? ?Patient wishes to proceed.  ? ?We will do this as soon as possible to maximize prognosis. ? ?I discussed that he would need to do no heavy lifting for at least 2 weeks and to avoid significant and repetitive bending or reaching. ? ?Orders: ?- NM lymphoscintigraphy ? ?History of syncope ?Assessment & Plan: ?Patient has not had any episodes for over 2 years. Given the extensive work-up that he had with cardiology for this, I do not think that this Joel affect his surgery. I discussed that he needs to make sure he is well-hydrated before surgery and after surgery. I also discussed avoiding alcohol a day or 2 before surgery. ? ? ? ?No follow-ups on file. ? ?Georgianne Fick, MD   ?

## 2021-09-22 ENCOUNTER — Encounter (HOSPITAL_COMMUNITY): Payer: Self-pay | Admitting: General Surgery

## 2021-09-27 LAB — SURGICAL PATHOLOGY

## 2021-11-22 DIAGNOSIS — D2261 Melanocytic nevi of right upper limb, including shoulder: Secondary | ICD-10-CM | POA: Diagnosis not present

## 2021-11-22 DIAGNOSIS — Z85820 Personal history of malignant melanoma of skin: Secondary | ICD-10-CM | POA: Diagnosis not present

## 2021-11-22 DIAGNOSIS — D2262 Melanocytic nevi of left upper limb, including shoulder: Secondary | ICD-10-CM | POA: Diagnosis not present

## 2021-11-22 DIAGNOSIS — L920 Granuloma annulare: Secondary | ICD-10-CM | POA: Diagnosis not present

## 2022-02-20 DIAGNOSIS — L821 Other seborrheic keratosis: Secondary | ICD-10-CM | POA: Diagnosis not present

## 2022-02-20 DIAGNOSIS — L920 Granuloma annulare: Secondary | ICD-10-CM | POA: Diagnosis not present

## 2022-02-20 DIAGNOSIS — Z85820 Personal history of malignant melanoma of skin: Secondary | ICD-10-CM | POA: Diagnosis not present

## 2022-02-20 DIAGNOSIS — L570 Actinic keratosis: Secondary | ICD-10-CM | POA: Diagnosis not present

## 2022-02-20 DIAGNOSIS — D225 Melanocytic nevi of trunk: Secondary | ICD-10-CM | POA: Diagnosis not present

## 2022-05-15 DIAGNOSIS — I10 Essential (primary) hypertension: Secondary | ICD-10-CM | POA: Diagnosis not present

## 2022-05-15 DIAGNOSIS — R1011 Right upper quadrant pain: Secondary | ICD-10-CM | POA: Diagnosis not present

## 2022-05-15 DIAGNOSIS — Z1211 Encounter for screening for malignant neoplasm of colon: Secondary | ICD-10-CM | POA: Diagnosis not present

## 2022-05-15 DIAGNOSIS — R1010 Upper abdominal pain, unspecified: Secondary | ICD-10-CM | POA: Diagnosis not present

## 2022-05-15 DIAGNOSIS — R142 Eructation: Secondary | ICD-10-CM | POA: Diagnosis not present

## 2022-05-15 DIAGNOSIS — Z13220 Encounter for screening for lipoid disorders: Secondary | ICD-10-CM | POA: Diagnosis not present

## 2022-05-15 DIAGNOSIS — Z131 Encounter for screening for diabetes mellitus: Secondary | ICD-10-CM | POA: Diagnosis not present

## 2022-05-15 DIAGNOSIS — Z136 Encounter for screening for cardiovascular disorders: Secondary | ICD-10-CM | POA: Diagnosis not present

## 2022-05-15 DIAGNOSIS — Z79899 Other long term (current) drug therapy: Secondary | ICD-10-CM | POA: Diagnosis not present

## 2022-05-18 DIAGNOSIS — Z1211 Encounter for screening for malignant neoplasm of colon: Secondary | ICD-10-CM | POA: Diagnosis not present

## 2022-05-18 DIAGNOSIS — R1011 Right upper quadrant pain: Secondary | ICD-10-CM | POA: Diagnosis not present

## 2022-05-18 DIAGNOSIS — R142 Eructation: Secondary | ICD-10-CM | POA: Diagnosis not present

## 2022-05-19 DIAGNOSIS — R1011 Right upper quadrant pain: Secondary | ICD-10-CM | POA: Diagnosis not present

## 2022-05-19 DIAGNOSIS — K760 Fatty (change of) liver, not elsewhere classified: Secondary | ICD-10-CM | POA: Diagnosis not present

## 2022-05-19 DIAGNOSIS — R1010 Upper abdominal pain, unspecified: Secondary | ICD-10-CM | POA: Diagnosis not present

## 2022-06-21 DIAGNOSIS — D123 Benign neoplasm of transverse colon: Secondary | ICD-10-CM | POA: Diagnosis not present

## 2022-06-21 DIAGNOSIS — Z1211 Encounter for screening for malignant neoplasm of colon: Secondary | ICD-10-CM | POA: Diagnosis not present

## 2022-06-21 DIAGNOSIS — R1011 Right upper quadrant pain: Secondary | ICD-10-CM | POA: Diagnosis not present
